# Patient Record
Sex: Male | Born: 1990 | Race: Black or African American | Hispanic: No | Marital: Single | Smoking: Former smoker
Health system: Southern US, Community
[De-identification: ages and names within clinical notes are randomized; demographics above are authoritative.]

## PROBLEM LIST (undated history)

## (undated) DIAGNOSIS — L0232 Furuncle of buttock: Secondary | ICD-10-CM

## (undated) HISTORY — PX: WISDOM TOOTH EXTRACTION: SHX21

---
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
## 2011-01-05 ENCOUNTER — Emergency Department (HOSPITAL_COMMUNITY)
Admission: EM | Admit: 2011-01-05 | Discharge: 2011-01-05 | Disposition: A | Discharge status: Home / Self Care | Payer: Medicaid Other | Attending: Emergency Medicine | Admitting: Emergency Medicine

## 2011-01-05 ENCOUNTER — Encounter: Payer: Self-pay | Admitting: *Deleted

## 2011-01-05 DIAGNOSIS — F17200 Nicotine dependence, unspecified, uncomplicated: Secondary | ICD-10-CM | POA: Insufficient documentation

## 2011-01-05 DIAGNOSIS — L0501 Pilonidal cyst with abscess: Secondary | ICD-10-CM | POA: Insufficient documentation

## 2011-01-05 HISTORY — DX: Furuncle of buttock: L02.32

## 2011-01-05 MED ORDER — LIDOCAINE-EPINEPHRINE 2 %-1:100000 IJ SOLN
20.0000 mL | Freq: Once | INTRAMUSCULAR | Status: AC
  Administered 2011-01-05: 20 mL via INTRADERMAL
  Filled 2011-01-05: qty 20

## 2011-01-05 MED ORDER — OXYCODONE-ACETAMINOPHEN 5-325 MG PO TABS
1.0000 | ORAL_TABLET | Freq: Once | ORAL | Status: AC
  Administered 2011-01-05: 1 via ORAL
  Filled 2011-01-05: qty 1

## 2011-01-05 MED ORDER — HYDROCODONE-ACETAMINOPHEN 5-500 MG PO TABS: 1.0000 | ORAL_TABLET | Freq: Four times a day (QID) | ORAL | Status: AC | PRN

## 2011-01-05 MED ORDER — DOXYCYCLINE HYCLATE 100 MG PO CAPS: 100.0000 mg | ORAL_CAPSULE | Freq: Two times a day (BID) | ORAL | Status: AC

## 2011-01-05 MED ORDER — LIDOCAINE HCL 2 % IJ SOLN
INTRAMUSCULAR | Status: AC
  Filled 2011-01-05: qty 1

## 2011-01-05 NOTE — ED Notes (Signed)
Pt states "I have a boil, been there like 2-3 days, it has a head on it"; pt indicates abcess to right buttock

## 2011-01-05 NOTE — ED Provider Notes (Signed)
History     CSN: [id number] Arrival date & time: 01/05/2011  6:09 PM   First MD Initiated Contact with Patient 01/05/11 1903      Chief Complaint  Patient presents with  . Abscess    (Consider location/radiation/quality/duration/timing/severity/associated sxs/prior treatment) HPI  Patient presents to emergency department complaining of a 2-3 day history of "boil" to his buttocks. Patient states symptoms are gradual onset, persistent, and worsening with increasing pain and swelling at the site of abscess. Patient states he had similar symptoms in the past but states "I drained it myself by popping it with a needle." Patient states pain is aggravated by touch, particularly by sitting on his buttocks. Patient denies fevers, chills, pain with defecation, abdominal pain, nausea, vomiting. Patient has not taken anything prior to arrival for pain. Patient denies any known medical problems and takes no medicines on regular basis.  Past Medical History  Diagnosis Date  . Boil of buttock     History reviewed. No pertinent past surgical history.  No family history on file.  History  Substance Use Topics  . Smoking status: Current Everyday Smoker -- 0.5 packs/day  . Smokeless tobacco: Not on file  . Alcohol Use: Yes     socially      Review of Systems  All other systems reviewed and are negative.    Allergies  Review of patient's allergies indicates no known allergies.  Home Medications  No current outpatient prescriptions on file.  BP 145/80  Pulse 82  Temp 98.7 F (37.1 C)  Resp 18  Wt 230 lb (104.327 kg)  SpO2 100%  Physical Exam  Nursing note and vitals reviewed. Constitutional: He is oriented to person, place, and time. He appears well-developed and well-nourished. No distress.  HENT:  Head: Normocephalic and atraumatic.  Eyes: Conjunctivae are normal.  Neck: Normal range of motion. Neck supple.  Cardiovascular: Normal rate, regular rhythm, normal heart sounds  and intact distal pulses.  Exam reveals no gallop and no friction rub.   No murmur heard. Pulmonary/Chest: Effort normal and breath sounds normal. No respiratory distress. He has no wheezes. He has no rales. He exhibits no tenderness.  Abdominal: Bowel sounds are normal. He exhibits no distension and no mass. There is no tenderness. There is no rebound and no guarding.  Musculoskeletal: Normal range of motion. He exhibits no edema and no tenderness.  Neurological: He is alert and oriented to person, place, and time.  Skin: Skin is warm and dry. No rash noted. He is not diaphoretic. There is erythema.       Quarter size swelling and induration of the coccyx region of buttocks with faint erythema and tenderness to palpation with central area of the opening with scant drainage. No tenderness to palpation of surrounding soft tissue buttocks. No radiation erythema.  Psychiatric: He has a normal mood and affect.    ED Course  Procedures (including critical care time)  INCISION AND DRAINAGE Performed by: Jenness Corner Consent: Verbal consent obtained. Risks and benefits: risks, benefits and alternatives were discussed Type: abscess  Body area: coccyx  Anesthesia: local infiltration  Local anesthetic: lidocaine 2% with epinephrine  Anesthetic total: 8 ml  Complexity: complex Blunt dissection to break up loculations  Drainage: purulent  Drainage amount: copious  Packing material: 1/4 in iodoform gauze  Patient tolerance: Patient tolerated the procedure well with no immediate complications.     Labs Reviewed - No data to display No results found.   1. Pilonidal abscess  MDM  No signs or symptoms of cellulitis. Afebrile. Abdomen soft and nontender. No surrounding soft tissue crepitous. No pain with defecation.         Jenness Corner, [place] 01/05/11 2036

## 2011-01-05 NOTE — ED Provider Notes (Signed)
Medical screening examination/treatment/procedure(s) were performed by non-physician practitioner and as supervising physician I was immediately available for consultation/collaboration. Devoria Albe, MD, Armando Gang   Ward Givens, MD 01/05/11 2258

## 2011-07-05 ENCOUNTER — Emergency Department (HOSPITAL_COMMUNITY): Payer: Medicaid Other

## 2011-07-05 ENCOUNTER — Emergency Department (HOSPITAL_COMMUNITY)
Admission: EM | Admit: 2011-07-05 | Discharge: 2011-07-05 | Disposition: A | Discharge status: Home / Self Care | Payer: Medicaid Other | Attending: Emergency Medicine | Admitting: Emergency Medicine

## 2011-07-05 ENCOUNTER — Encounter (HOSPITAL_COMMUNITY): Payer: Self-pay | Admitting: Emergency Medicine

## 2011-07-05 DIAGNOSIS — F17200 Nicotine dependence, unspecified, uncomplicated: Secondary | ICD-10-CM | POA: Insufficient documentation

## 2011-07-05 DIAGNOSIS — M436 Torticollis: Secondary | ICD-10-CM | POA: Insufficient documentation

## 2011-07-05 MED ORDER — HYDROCODONE-ACETAMINOPHEN 5-325 MG PO TABS
2.0000 | ORAL_TABLET | Freq: Once | ORAL | Status: AC
  Administered 2011-07-05: 2 via ORAL
  Filled 2011-07-05: qty 2

## 2011-07-05 MED ORDER — IBUPROFEN 800 MG PO TABS
800.0000 mg | ORAL_TABLET | Freq: Once | ORAL | Status: AC
  Administered 2011-07-05: 800 mg via ORAL
  Filled 2011-07-05: qty 1

## 2011-07-05 MED ORDER — IBUPROFEN 800 MG PO TABS: 800.0000 mg | ORAL_TABLET | Freq: Three times a day (TID) | ORAL | Status: AC

## 2011-07-05 MED ORDER — HYDROCODONE-ACETAMINOPHEN 5-325 MG PO TABS: 2.0000 | ORAL_TABLET | ORAL | Status: AC | PRN

## 2011-07-05 MED ORDER — DIAZEPAM 5 MG PO TABS
5.0000 mg | ORAL_TABLET | Freq: Once | ORAL | Status: AC
  Administered 2011-07-05: 5 mg via ORAL
  Filled 2011-07-05: qty 1

## 2011-07-05 MED ORDER — DIAZEPAM 5 MG PO TABS: 5.0000 mg | ORAL_TABLET | Freq: Two times a day (BID) | ORAL | Status: AC

## 2011-07-05 NOTE — Discharge Instructions (Signed)
Torticollis, Acute You have suddenly (acutely) developed a twisted neck (torticollis). This is usually a self-limited condition. CAUSES  Acute torticollis may be caused by malposition, trauma or infection. Most commonly, acute torticollis is caused by sleeping in an awkward position. Torticollis may also be caused by the flexion, extension or twisting of the neck muscles beyond their normal position. Sometimes, the exact cause may not be known. SYMPTOMS  Usually, there is pain and limited movement of the neck. Your neck may twist to one side. DIAGNOSIS  The diagnosis is often made by physical examination. X-rays, CT scans or MRIs may be done if there is a history of trauma or concern of infection. TREATMENT  For a common, stiff neck that develops during sleep, treatment is focused on relaxing the contracted neck muscle. Medications (including shots) may be used to treat the problem. Most cases resolve in several days. Torticollis usually responds to conservative physical therapy. If left untreated, the shortened and spastic neck muscle can cause deformities in the face and neck. Rarely, surgery is required. HOME CARE INSTRUCTIONS   Use over-the-counter and prescription medications as directed by your caregiver.   Do stretching exercises and massage the neck as directed by your caregiver.   Follow up with physical therapy if needed and as directed by your caregiver.  SEEK IMMEDIATE MEDICAL CARE IF:   You develop difficulty breathing or noisy breathing (stridor).   You drool, develop trouble swallowing or have pain with swallowing.   You develop numbness or weakness in the hands or feet.   You have changes in speech or vision.   You have problems with urination or bowel movements.   You have difficulty walking.   You have a fever.   You have increased pain.  MAKE SURE YOU:   Understand these instructions.   Will watch your condition.   Will get help right away if you are not  doing well or get worse.  Document Released: 01/06/2000 Document Revised: 12/28/2010 Document Reviewed: 02/16/2009 [employer] Patient Information 2012 [employer]. 

## 2011-07-05 NOTE — ED Notes (Signed)
Pt given rx x2, discharge and follow up information without further questions after speaking with MD. Pt denies further needs at this time and ambulates to lobby in NAD

## 2011-07-05 NOTE — ED Notes (Signed)
PT. REPORTS LEFT NECK PAIN / STIFFNESS ONSET THIS MORNING WHILE STRETCHING AFTER GETTING UP , DENIES FEVER OR CHILLS.

## 2011-07-07 NOTE — ED Provider Notes (Signed)
History     CSN: [id number]  Arrival date & time 07/05/11  2011   First MD Initiated Contact with Patient 07/05/11 2222      Chief Complaint  Patient presents with  . Neck Pain    (Consider location/radiation/quality/duration/timing/severity/associated sxs/prior treatment) HPI Comments: Patient presents with L paraspinal neck pain that started the morning prior to presentation.  He states he was stretching in bed, felt a "pop" in his neck, and has had pain and stiffness since.  No fever, vomiting, weakness, numbness, tingling, vision change, headache.  The history is provided by the patient.    Past Medical History  Diagnosis Date  . Boil of buttock     History reviewed. No pertinent past surgical history.  No family history on file.  History  Substance Use Topics  . Smoking status: Current Everyday Smoker -- 0.5 packs/day  . Smokeless tobacco: Not on file  . Alcohol Use: Yes     socially      Review of Systems  Constitutional: Negative for activity change and appetite change.  HENT: Positive for neck pain and neck stiffness. Negative for congestion and rhinorrhea.   Respiratory: Negative for cough, chest tightness and shortness of breath.   Cardiovascular: Negative for chest pain.  Gastrointestinal: Negative for nausea, vomiting and abdominal pain.  Genitourinary: Negative for dysuria and hematuria.  Musculoskeletal: Negative for back pain.  Skin: Negative for rash.  Neurological: Negative for headaches.    Allergies  Review of patient's allergies indicates no known allergies.  Home Medications   Current Outpatient Rx  Name Route Sig Dispense Refill  . DIAZEPAM 5 MG PO TABS Oral Take 1 tablet (5 mg total) by mouth 2 (two) times daily. 6 tablet 0  . HYDROCODONE-ACETAMINOPHEN 5-325 MG PO TABS Oral Take 2 tablets by mouth every 4 (four) hours as needed for pain. 10 tablet 0  . IBUPROFEN 800 MG PO TABS Oral Take 1 tablet (800 mg total) by mouth 3 (three) times  daily. 21 tablet 0    BP 113/73  Pulse 58  Temp 98.7 F (37.1 C) (Oral)  Resp 16  SpO2 100%  Physical Exam  Constitutional: He is oriented to person, place, and time. He appears well-developed and well-nourished. No distress.  HENT:  Head: Normocephalic and atraumatic.  Mouth/Throat: Oropharynx is clear and moist. No oropharyngeal exudate.  Eyes: Conjunctivae and EOM are normal. Pupils are equal, round, and reactive to light.  Neck: Normal range of motion. Neck supple.       Head rotated to R.  TTP L paraspinal muscles, trapezius, and SCM with spasm.  Cardiovascular: Normal rate, regular rhythm and normal heart sounds.   No murmur heard. Pulmonary/Chest: Effort normal and breath sounds normal. No respiratory distress.  Abdominal: Soft. There is no tenderness. There is no rebound and no guarding.  Musculoskeletal: Normal range of motion. He exhibits no edema and no tenderness.  Neurological: He is alert and oriented to person, place, and time. No cranial nerve deficit.       Equal grip strengths bilaterally    ED Course  Procedures (including critical care time)  Labs Reviewed - No data to display No results found.   1. Torticollis       MDM  Neck pain and spasm after stretching.  NO weakness, numbness, tingling, fever, vomiting.  Exam and history consistent with torticollis. NSAIDS, heat, muscle relaxers.  Follow up PCP.        Glynn Octave, MD 07/07/11 [phone]

## 2011-10-15 ENCOUNTER — Encounter (HOSPITAL_COMMUNITY): Payer: Self-pay | Admitting: Emergency Medicine

## 2011-10-15 ENCOUNTER — Emergency Department (HOSPITAL_COMMUNITY): Payer: Medicaid Other

## 2011-10-15 ENCOUNTER — Emergency Department (HOSPITAL_COMMUNITY)
Admission: EM | Admit: 2011-10-15 | Discharge: 2011-10-16 | Disposition: A | Discharge status: Home / Self Care | Payer: Medicaid Other | Attending: Emergency Medicine | Admitting: Emergency Medicine

## 2011-10-15 DIAGNOSIS — F17200 Nicotine dependence, unspecified, uncomplicated: Secondary | ICD-10-CM | POA: Insufficient documentation

## 2011-10-15 DIAGNOSIS — S61509A Unspecified open wound of unspecified wrist, initial encounter: Secondary | ICD-10-CM | POA: Insufficient documentation

## 2011-10-15 DIAGNOSIS — IMO0002 Reserved for concepts with insufficient information to code with codable children: Secondary | ICD-10-CM

## 2011-10-15 DIAGNOSIS — W268XXA Contact with other sharp object(s), not elsewhere classified, initial encounter: Secondary | ICD-10-CM | POA: Insufficient documentation

## 2011-10-15 NOTE — ED Notes (Signed)
PT. SLIPPED AND FELL ON BEER BOTTLE THIS EVENING , PRESENTS WITH LACERATION APPROX. 1 INCH AT RIGHT ANTERIOR WRIST , DRY PRESSURE DRESSING APPLIED AT TRIAGE WITH SLIGHT BLEEDING .

## 2011-10-16 NOTE — ED Provider Notes (Signed)
History     CSN: [id number]  Arrival date & time 10/15/11  2123   First MD Initiated Contact with Patient 10/16/11 0010      Chief Complaint  Patient presents with  . Laceration    (Consider location/radiation/quality/duration/timing/severity/associated sxs/prior treatment) HPI Comments: Laceration to inner R wrist full ROM. Denied numbness loss of function   Patient is a 21 y.o. male presenting with skin laceration. The history is provided by the patient.  Laceration  The incident occurred 1 to 2 hours ago. The laceration is located on the right arm. The laceration is 1 cm in size. The laceration mechanism was a broken glass. The pain is at a severity of 3/10. The pain is mild. The pain has been constant since onset. He reports no foreign bodies present. His tetanus status is UTD.    Past Medical History  Diagnosis Date  . Boil of buttock     History reviewed. No pertinent past surgical history.  No family history on file.  History  Substance Use Topics  . Smoking status: Current Every Day Smoker -- 0.5 packs/day  . Smokeless tobacco: Not on file  . Alcohol Use: Yes     socially      Review of Systems  Gastrointestinal: Negative for nausea.  Musculoskeletal: Negative for joint swelling.  Skin: Positive for wound.  Neurological: Negative for weakness and numbness.    Allergies  Review of patient's allergies indicates no known allergies.  Home Medications  No current outpatient prescriptions on file.  BP 127/75  Pulse 55  Temp 98 F (36.7 C) (Oral)  Resp 18  SpO2 100%  Physical Exam  Constitutional: He appears well-developed.  HENT:  Head: Normocephalic.  Eyes: Pupils are equal, round, and reactive to light.  Neck: Normal range of motion.  Cardiovascular: Normal rate.   Pulmonary/Chest: Effort normal.  Musculoskeletal: Normal range of motion. He exhibits no edema and no tenderness.       Right wrist: He exhibits laceration. He exhibits normal range  of motion, no tenderness and no swelling.  Neurological: He is alert.  Skin: Skin is warm.    ED Course  LACERATION REPAIR Date/Time: 10/16/2011 12:44 AM Performed by: Arman Filter Authorized by: Arman Filter Consent: Verbal consent obtained. Written consent not obtained. Risks and benefits: risks, benefits and alternatives were discussed Consent given by: patient Patient understanding: patient states understanding of the procedure being performed Patient identity confirmed: verbally with patient Time out: Immediately prior to procedure a "time out" was called to verify the correct patient, procedure, equipment, support staff and site/side marked as required. Body area: upper extremity Location details: right wrist Laceration length: 1 cm Foreign bodies: glass Tendon involvement: none Nerve involvement: none Vascular damage: no Anesthesia: local infiltration Local anesthetic: lidocaine 1% with epinephrine Anesthetic total: 2 ml Patient sedated: no Preparation: Patient was prepped and draped in the usual sterile fashion. Irrigation solution: saline Irrigation method: syringe Amount of cleaning: extensive Debridement: none Degree of undermining: none Skin closure: 4-0 Prolene Number of sutures: 5 Approximation: close Approximation difficulty: simple Dressing: antibiotic ointment and gauze packing Patient tolerance: Patient tolerated the procedure well with no immediate complications.   (including critical care time)  Labs Reviewed - No data to display Dg Wrist Complete Right  10/15/2011  *RADIOLOGY REPORT*  Clinical Data: Laceration.  RIGHT WRIST - COMPLETE 3+ VIEW  Comparison: None.  Findings: Anatomic alignment bones of the right wrist.  Bandage was applied to the radial aspect of  the wrist.  No fracture or radiopaque foreign body.  IMPRESSION: No acute abnormality.  No radiopaque foreign body.   Original Report Authenticated By: Andreas Newport, M.D.      1.  Laceration       MDM  Laceration         Arman Filter, NP 10/16/11 0045  Arman Filter, NP 10/16/11 0045

## 2011-10-16 NOTE — ED Provider Notes (Signed)
Medical screening examination/treatment/procedure(s) were performed by non-physician practitioner and as supervising physician I was immediately available for consultation/collaboration.  Sunnie Nielsen, MD 10/16/11 [phone]

## 2011-10-26 ENCOUNTER — Encounter (HOSPITAL_COMMUNITY): Payer: Self-pay | Admitting: *Deleted

## 2011-10-26 ENCOUNTER — Emergency Department (HOSPITAL_COMMUNITY)
Admission: EM | Admit: 2011-10-26 | Discharge: 2011-10-26 | Disposition: A | Discharge status: Home / Self Care | Payer: Medicaid Other | Attending: Emergency Medicine | Admitting: Emergency Medicine

## 2011-10-26 DIAGNOSIS — Z4802 Encounter for removal of sutures: Secondary | ICD-10-CM | POA: Insufficient documentation

## 2011-10-26 DIAGNOSIS — F17200 Nicotine dependence, unspecified, uncomplicated: Secondary | ICD-10-CM | POA: Insufficient documentation

## 2011-10-26 NOTE — ED Provider Notes (Signed)
History   This chart was scribed for Gerhard Munch, MD by Melba Coon. The patient was seen in room TR05C/TR05C and the patient's care was started at 3:44PM.    CSN: [id number]  Arrival date & time 10/26/11  1516   First MD Initiated Contact with Patient 10/26/11 1533      Chief Complaint  Patient presents with  . Suture / Staple Removal    (Consider location/radiation/quality/duration/timing/severity/associated sxs/prior treatment) The history is provided by the patient. No language interpreter was used.   Seth Anderson is a 21 y.o. male who presents to the Emergency Department for a suture removal from the right wrist that were placed 11 days ago after he cut himself with glass. Wound appears well healed. Denies fever and chills. No other complaints. No other pertinent medical symptoms.  Past Medical History  Diagnosis Date  . Boil of buttock     History reviewed. No pertinent past surgical history.  No family history on file.  History  Substance Use Topics  . Smoking status: Current Every Day Smoker -- 0.5 packs/day  . Smokeless tobacco: Not on file  . Alcohol Use: Yes     socially      Review of Systems 10 Systems reviewed and all are negative for acute change except as noted in the HPI.   Allergies  Review of patient's allergies indicates no known allergies.  Home Medications  No current outpatient prescriptions on file.  BP 110/60  Pulse 62  Temp 97.9 F (36.6 C) (Oral)  Resp 18  SpO2 99%  Physical Exam  Nursing note and vitals reviewed. Constitutional: He is oriented to person, place, and time. He appears well-developed and well-nourished. No distress.  HENT:  Head: Normocephalic and atraumatic.  Eyes: EOM are normal.  Neck: Neck supple. No tracheal deviation present.  Cardiovascular: Normal rate.   Pulmonary/Chest: Effort normal. No respiratory distress.  Musculoskeletal: Normal range of motion.  Neurological: He is alert and oriented  to person, place, and time.  Skin: Skin is warm and dry.       Wound appears clean, dry and intact.  Psychiatric: He has a normal mood and affect. His behavior is normal.    ED Course  SUTURE REMOVAL Date/Time: 10/26/2011 3:00 PM Performed by: Gerhard Munch Authorized by: Gerhard Munch Consent: Verbal consent obtained. Time out: Immediately prior to procedure a "time out" was called to verify the correct patient, procedure, equipment, support staff and site/side marked as required. Body area: upper extremity Location details: right wrist Wound Appearance: clean Sutures Removed: 5 Post-removal: dressing applied Facility: sutures placed in this facility Patient tolerance: Patient tolerated the procedure well with no immediate complications.   (including critical care time)  COORDINATION OF CARE:  3:45PM - 5 sutures were removed and site will be covered with a Band-Aid. Pt is ready for d/c.   Labs Reviewed - No data to display No results found.   No diagnosis found.    MDM  I personally performed the services described in this documentation, which was scribed in my presence. The recorded information has been reviewed and considered.  The patient presents for suture removal.  Absent interval changes, or concerning findings today he was discharged in stable condition after his sutures were removed.   Gerhard Munch, MD 10/26/11 1726

## 2011-10-26 NOTE — ED Notes (Signed)
Pt here for a suture removal  He had sutures in the rt wrist 11 days ago.  Suture line appears well-healed but some redness surrounding

## 2012-11-06 ENCOUNTER — Encounter: Payer: Self-pay | Admitting: Internal Medicine

## 2012-11-06 ENCOUNTER — Ambulatory Visit: Payer: Medicaid Other | Attending: Internal Medicine | Admitting: Internal Medicine

## 2012-11-06 VITALS — BP 119/76 | HR 84 | Temp 98.8°F | Resp 18

## 2012-11-06 DIAGNOSIS — M25569 Pain in unspecified knee: Secondary | ICD-10-CM | POA: Insufficient documentation

## 2012-11-06 DIAGNOSIS — G8929 Other chronic pain: Secondary | ICD-10-CM | POA: Insufficient documentation

## 2012-11-06 DIAGNOSIS — M545 Low back pain, unspecified: Secondary | ICD-10-CM | POA: Insufficient documentation

## 2012-11-06 DIAGNOSIS — M549 Dorsalgia, unspecified: Secondary | ICD-10-CM

## 2012-11-06 MED ORDER — IBUPROFEN 600 MG PO TABS: 600.0000 mg | ORAL_TABLET | Freq: Three times a day (TID) | ORAL | Status: DC | PRN

## 2012-11-06 NOTE — Progress Notes (Signed)
Patient ID: Seth Anderson, male   DOB: 03/21/1990, 22 y.o.   MRN: [id number]  Chief complaint Low back and right knee pain   History of present illness Young African American male here to establish care. Patient reports symptoms of low back pain for possible years and has been told that he has mild scoliosis. He denies having any imaging done in the past. He also reports having pain in his right knee and been told of having some tendinitis. He reports having pain over the back and then even at rest aggravated with movement. Denies any radiation of the pain. Denies fever, chills, headache, blurred vision, dizziness, chest pain, palpitation, shortness of breath, nausea, vomiting, abdominal pain, bowel or urinary symptoms. Denies tingling or numbness of the extremity.    Vital signs in last 24 hours:  Filed Vitals:   11/06/12 1526  BP: 119/76  Pulse: 84  Temp: 98.8 F (37.1 C)  TempSrc: Oral  Resp: 18  SpO2: 97%     Physical Exam:  General: Young male in no acute distress. HEENT: no pallor, no icterus, moist oral mucosa,  Heart: Normal  s1 &s2  Regular rate and rhythm, without murmurs, rubs, gallops. Lungs: Clear to auscultation bilaterally. Abdomen: Soft, nontender, nondistended, positive bowel sounds. Extremities: No clubbing cyanosis or edema with positive pedal pulses. No lower back abnormality or swelling. SLTR negative. Normal range of motion of right knee. No swelling or deformity Neuro: Alert, awake, oriented x3, nonfocal.   Lab Results:  Basic Metabolic Panel: No results found for this basename: na, k, cl, co2, bun, creatinine, glucose, calcium   CBC: No results found for this basename: wbc, hgb, hct, plt, mcv, neutroabs, lymphsabs, monoabs, eosabs, basosabs    No results found for this or any previous visit (from the past 240 hour(s)).  Studies/Results: No results found.  Medications:   Assessment/Plan:  Chronic low back and right knee pain Obtain x ray   of his lumbar spine and right knee. We'll order when necessary Motrin for pain. instructed ROM exercise Follow up in 4 weeks    Harjit Leider 11/06/2012, 4:05 PM

## 2012-11-06 NOTE — Progress Notes (Signed)
Pt here to establish care for chronic back/knee pain for yrs. No injury reported C/o sharp,achy pain No medical hx noted

## 2012-11-12 ENCOUNTER — Other Ambulatory Visit (HOSPITAL_COMMUNITY): Payer: Self-pay

## 2012-12-11 ENCOUNTER — Ambulatory Visit: Payer: Self-pay | Admitting: Internal Medicine

## 2013-02-18 ENCOUNTER — Ambulatory Visit: Payer: Self-pay

## 2013-06-22 ENCOUNTER — Emergency Department (HOSPITAL_COMMUNITY)
Admission: EM | Admit: 2013-06-22 | Discharge: 2013-06-23 | Disposition: A | Discharge status: Home / Self Care | Payer: Self-pay | Attending: Emergency Medicine | Admitting: Emergency Medicine

## 2013-06-22 ENCOUNTER — Emergency Department (HOSPITAL_COMMUNITY): Payer: Self-pay

## 2013-06-22 ENCOUNTER — Encounter (HOSPITAL_COMMUNITY): Payer: Self-pay | Admitting: Emergency Medicine

## 2013-06-22 DIAGNOSIS — Z872 Personal history of diseases of the skin and subcutaneous tissue: Secondary | ICD-10-CM | POA: Insufficient documentation

## 2013-06-22 DIAGNOSIS — Y9367 Activity, basketball: Secondary | ICD-10-CM | POA: Insufficient documentation

## 2013-06-22 DIAGNOSIS — S93402A Sprain of unspecified ligament of left ankle, initial encounter: Secondary | ICD-10-CM

## 2013-06-22 DIAGNOSIS — Y9239 Other specified sports and athletic area as the place of occurrence of the external cause: Secondary | ICD-10-CM | POA: Insufficient documentation

## 2013-06-22 DIAGNOSIS — M25572 Pain in left ankle and joints of left foot: Secondary | ICD-10-CM

## 2013-06-22 DIAGNOSIS — F17200 Nicotine dependence, unspecified, uncomplicated: Secondary | ICD-10-CM | POA: Insufficient documentation

## 2013-06-22 DIAGNOSIS — X500XXA Overexertion from strenuous movement or load, initial encounter: Secondary | ICD-10-CM | POA: Insufficient documentation

## 2013-06-22 DIAGNOSIS — S93409A Sprain of unspecified ligament of unspecified ankle, initial encounter: Secondary | ICD-10-CM | POA: Insufficient documentation

## 2013-06-22 DIAGNOSIS — Y92838 Other recreation area as the place of occurrence of the external cause: Secondary | ICD-10-CM

## 2013-06-22 NOTE — ED Notes (Signed)
Patient was playing basketball and rolled left ankle.  Patient states that there is pain, with some swelling on lateral aspect of left ankle.

## 2013-06-22 NOTE — ED Notes (Signed)
Patient transported to X-ray 

## 2013-06-22 NOTE — ED Notes (Signed)
Left ankle is elevated and ice given to patient for comfort.

## 2013-06-22 NOTE — ED Provider Notes (Signed)
CSN: [id number]     Arrival date & time 06/22/13  2042 History  This chart was scribed for a non-physician practitioner, Raymon Mutton PA-C, working with Geoffery Lyons, MD by [place], ED Scribe. The patient was seen in TR09C/TR09C. The patient's care was started at 10:42 PM.  Chief Complaint  Patient presents with  . Ankle Pain    The history is provided by the patient. No language interpreter was used.    HPI Comments: Seth Anderson is a 23 y.o. male who presents to the Emergency Department complaining of throbbing, constant left ankle pain that radiates up the lower leg with associated swelling to the lateral side of ankle. Pt states he fell and everted his ankle while playing basketball around 4:00PM today. He is not having any foot pain. He states that he took 2 tablets of generic pain medication prior to arrival. He also states that he has been elevating his ankle to alleviate swelling but has not applied any ice to it. Pt denies any numbness, tingling, or weakness in the left lower extremity. He says he has no prior injury to his ankle. He has no history of DM or HTN.  Past Medical History  Diagnosis Date  . Boil of buttock    History reviewed. No pertinent past surgical history. Family History  Problem Relation Age of Onset  . Diabetes Mother    History  Substance Use Topics  . Smoking status: Current Every Day Smoker -- 0.50 packs/day  . Smokeless tobacco: Not on file     Comment: states he will cut back  . Alcohol Use: Yes     Comment: socially    Review of Systems  Musculoskeletal: Positive for arthralgias (left ankle).  Neurological: Negative for weakness and numbness.      Allergies  Review of patient's allergies indicates no known allergies.  Home Medications   Prior to Admission medications   Medication Sig Start Date End Date Taking? Authorizing Provider  HYDROcodone-acetaminophen (NORCO/VICODIN) 5-325 MG per tablet Take 1 tablet by mouth every 6  (six) hours as needed for moderate pain or severe pain. 06/23/13   Breslin Burklow, PA-C  ibuprofen (ADVIL,MOTRIN) 600 MG tablet Take 1 tablet (600 mg total) by mouth every 8 (eight) hours as needed for pain. 11/06/12   Nishant Dhungel, MD  ibuprofen (ADVIL,MOTRIN) 600 MG tablet Take 1 tablet (600 mg total) by mouth every 6 (six) hours as needed. 06/23/13   Cyndal Kasson, PA-C   Triage Vitals: BP 112/57  Pulse 59  Temp(Src) 98.2 F (36.8 C) (Oral)  Resp 15  SpO2 99% Physical Exam  Nursing note and vitals reviewed. Constitutional: He is oriented to person, place, and time. He appears well-developed and well-nourished. No distress.  HENT:  Head: Normocephalic and atraumatic.  Eyes: Conjunctivae and EOM are normal. Pupils are equal, round, and reactive to light. Right eye exhibits no discharge. Left eye exhibits no discharge.  Neck: Normal range of motion. Neck supple.  Cardiovascular: Normal rate, regular rhythm and normal heart sounds.  Exam reveals no friction rub.   No murmur heard. Pulses:      Radial pulses are 2+ on the right side, and 2+ on the left side.       Dorsalis pedis pulses are 2+ on the right side, and 2+ on the left side.       Posterior tibial pulses are 2+ on the right side, and 2+ on the left side.  Pulmonary/Chest: Effort normal and breath sounds  normal. No respiratory distress. He has no wheezes. He has no rales.  Musculoskeletal: He exhibits edema and tenderness.       Left foot: He exhibits decreased range of motion, tenderness, bony tenderness and swelling. He exhibits normal capillary refill, no crepitus, no deformity and no laceration.       Feet:  Swelling and beginnings of ecchymosis identified to the lateral aspect of the left ankle. Discomfort upon palpation to the lateral malleolus-talus fibular ligament region. Decreased inversion, eversion, dorsi flexion plantar flexion secondary to pain-motion still intact. Patient is able to wiggle toes.  Neurological: He  is alert and oriented to person, place, and time. No cranial nerve deficit. He exhibits normal muscle tone. Coordination normal.  Cranial nerves III-XII grossly intact Strength 5+/5+ to lower extremities bilaterally with resistance applied, equal distribution noted Strength intact to the digits of the left foot Sensation intact with differentiation sharp and dull touch Gait proper, proper balance - negative sway, negative drift, negative step-offs  Skin: Skin is warm and dry. No rash noted. He is not diaphoretic. No erythema.  Psychiatric: He has a normal mood and affect. His behavior is normal. Thought content normal.    ED Course  Procedures (including critical care time) DIAGNOSTIC STUDIES: Oxygen Saturation is 99% on room air, normal by my interpretation.    COORDINATION OF CARE: 10:48 PM- Treatment plan was discussed with patient who verbalizes understanding and agrees.     Labs Review Labs Reviewed - No data to display  Imaging Review Dg Tibia/fibula Left  06/23/2013   CLINICAL DATA:  Basketball injury.  Left leg pain.  EXAM: LEFT TIBIA AND FIBULA - 2 VIEW  COMPARISON:  None.  FINDINGS: There is no evidence of fracture or other focal bone lesions. Soft tissues are unremarkable.  IMPRESSION: Negative.   Electronically Signed   By: Amie Portland M.D.   On: 06/23/2013 00:05   Dg Ankle Complete Left  06/22/2013   CLINICAL DATA:  Injury while playing basketball, medial ankle pain  EXAM: LEFT ANKLE COMPLETE - 3+ VIEW  COMPARISON:  None  FINDINGS: Soft tissue swelling about the ankle without evidence of acute fracture or malalignment. The ankle mortise remains congruent. The talar dome is intact. No ankle joint effusion. Normal bony mineralization.  IMPRESSION: Soft tissue swelling without acute fracture or malalignment.   Electronically Signed   By: Malachy Moan M.D.   On: 06/22/2013 21:30     EKG Interpretation None      MDM   Final diagnoses:  Left ankle pain  Left ankle  sprain   Medications  ibuprofen (ADVIL,MOTRIN) tablet 800 mg (not administered)    Filed Vitals:   06/22/13 2049 06/22/13 2331  BP: 112/57 111/89  Pulse: 59 49  Temp: 98.2 F (36.8 C) 98 F (36.7 C)  TempSrc: Oral Oral  Resp: 15 18  SpO2: 99% 100%   I personally performed the services described in this documentation, which was scribed in my presence. The recorded information has been reviewed and is accurate.  Plain film of left ankle noted soft tissue swelling without acute fracture or malalignment. Plain film left tib-fib region negative for acute osseous injury. Negative findings or dislocation. Plain films unremarkable. Patient neurovascularly intact. Negative focal neurological deficits noted. Sensation intact. Suspicion to be ankle sprain, cannot rule out possible talofibular ligament injury. Patient placed in ASO and crutches given for comfort. Patient not septic appearing. Patient stable, afebrile. Discharged patient. Discharge patient with small dose of pain medications-discussed  with patient course, cautions, disposal technique. Referred patient to orthopedics and primary care provider. Discussed with patient to rest, ice, elevate. Discussed with patient to avoid any physical or strenuous activity. Discussed with patient to closely monitor symptoms and if symptoms are to worsen or change to report back to the ED - strict return instructions given.  Patient agreed to plan of care, understood, all questions answered.   AGCO CorporationMarissa Iridian Reader, PA-C 06/23/13 0405

## 2013-06-23 MED ORDER — HYDROCODONE-ACETAMINOPHEN 5-325 MG PO TABS: 1.0000 | ORAL_TABLET | Freq: Once | ORAL | Status: DC

## 2013-06-23 MED ORDER — IBUPROFEN 400 MG PO TABS
800.0000 mg | ORAL_TABLET | Freq: Once | ORAL | Status: AC
  Administered 2013-06-23: 800 mg via ORAL
  Filled 2013-06-23: qty 2

## 2013-06-23 MED ORDER — IBUPROFEN 600 MG PO TABS: 600.0000 mg | ORAL_TABLET | Freq: Four times a day (QID) | ORAL | Status: DC | PRN

## 2013-06-23 MED ORDER — HYDROCODONE-ACETAMINOPHEN 5-325 MG PO TABS: 1.0000 | ORAL_TABLET | Freq: Four times a day (QID) | ORAL | Status: DC | PRN

## 2013-06-23 NOTE — ED Provider Notes (Signed)
Medical screening examination/treatment/procedure(s) were performed by non-physician practitioner and as supervising physician I was immediately available for consultation/collaboration.     Geoffery Lyons, MD 06/23/13 [phone]

## 2013-06-23 NOTE — Discharge Instructions (Signed)
Please call your doctor for a followup appointment within 24-48 hours. When you talk to your doctor please let them know that you were seen in the emergency department and have them acquire all of your records so that they can discuss the findings with you and formulate a treatment plan to fully care for your new and ongoing problems. Please call and set-up an appointment with your primary care provider to be seen and re-assessed Please rest and stay hydrated While on pain medications there is be no drinking alcohol, driving, operating any heavy machinery if there is extra please dispose in a proper manner. Please do not take any extra Tylenol with this medication for this can lead to Tylenol overdose and liver failure. Please avoid any physical or strenuous activity Please do not apply pressure to the left foot - please use crutches at all times Please rest, ice, and elevate - please ice at least 5-6 times per day at 20 minute intervals Please keep ASO brace on when active, can remove when resting Please continue to monitor symptoms closely and if symptoms are to worsen or change (fever greater than 101, chills, sweating, nausea, vomiting, chest pain, shortness of breath, difficulty breathing, fall, injury, numbness, tingling, loss of sensation, swelling, red streaks, decreased range of motion to the toes) please report back to the ED immediately    Ankle Sprain An ankle sprain is an injury to the strong, fibrous tissues (ligaments) that hold the bones of your ankle joint together.  CAUSES An ankle sprain is usually caused by a fall or by twisting your ankle. Ankle sprains most commonly occur when you step on the outer edge of your foot, and your ankle turns inward. People who participate in sports are more prone to these types of injuries.  SYMPTOMS   Pain in your ankle. The pain may be present at rest or only when you are trying to stand or walk.  Swelling.  Bruising. Bruising may develop  immediately or within 1 to 2 days after your injury.  Difficulty standing or walking, particularly when turning corners or changing directions. DIAGNOSIS  Your caregiver will ask you details about your injury and perform a physical exam of your ankle to determine if you have an ankle sprain. During the physical exam, your caregiver will press on and apply pressure to specific areas of your foot and ankle. Your caregiver will try to move your ankle in certain ways. An X-ray exam may be done to be sure a bone was not broken or a ligament did not separate from one of the bones in your ankle (avulsion fracture).  TREATMENT  Certain types of braces can help stabilize your ankle. Your caregiver can make a recommendation for this. Your caregiver may recommend the use of medicine for pain. If your sprain is severe, your caregiver may refer you to a surgeon who helps to restore function to parts of your skeletal system (orthopedist) or a physical therapist. HOME CARE INSTRUCTIONS   Apply ice to your injury for 1 2 days or as directed by your caregiver. Applying ice helps to reduce inflammation and pain.  Put ice in a plastic bag.  Place a towel between your skin and the bag.  Leave the ice on for 15-20 minutes at a time, every 2 hours while you are awake.  Only take over-the-counter or prescription medicines for pain, discomfort, or fever as directed by your caregiver.  Elevate your injured ankle above the level of your heart as  much as possible for 2 3 days.  If your caregiver recommends crutches, use them as instructed. Gradually put weight on the affected ankle. Continue to use crutches or a cane until you can walk without feeling pain in your ankle.  If you have a plaster splint, wear the splint as directed by your caregiver. Do not rest it on anything harder than a pillow for the first 24 hours. Do not put weight on it. Do not get it wet. You may take it off to take a shower or bath.  You may  have been given an elastic bandage to wear around your ankle to provide support. If the elastic bandage is too tight (you have numbness or tingling in your foot or your foot becomes cold and blue), adjust the bandage to make it comfortable.  If you have an air splint, you may blow more air into it or let air out to make it more comfortable. You may take your splint off at night and before taking a shower or bath. Wiggle your toes in the splint several times per day to decrease swelling. SEEK MEDICAL CARE IF:   You have rapidly increasing bruising or swelling.  Your toes feel extremely cold or you lose feeling in your foot.  Your pain is not relieved with medicine. SEEK IMMEDIATE MEDICAL CARE IF:  Your toes are numb or blue.  You have severe pain that is increasing. MAKE SURE YOU:   Understand these instructions.  Will watch your condition.  Will get help right away if you are not doing well or get worse. Document Released: 01/08/2005 Document Revised: 10/03/2011 Document Reviewed: 01/20/2011 [facility] Patient Information 2014 [place], [place].   Emergency Department Resource Guide 1) Find a Doctor and Pay Out of Pocket Although you won't have to find out who is covered by your insurance plan, it is a good idea to ask around and get recommendations. You will then need to call the office and see if the doctor you have chosen will accept you as a new patient and what types of options they offer for patients who are self-pay. Some doctors offer discounts or will set up payment plans for their patients who do not have insurance, but you will need to ask so you aren't surprised when you get to your appointment.  2) Contact Your Local Health Department Not all health departments have doctors that can see patients for sick visits, but many do, so it is worth a call to see if yours does. If you don't know where your local health department is, you can check in your phone book. The CDC also has a  tool to help you locate your state's health department, and many state websites also have listings of all of their local health departments.  3) Find a Walk-in Clinic If your illness is not likely to be very severe or complicated, you may want to try a walk in clinic. These are popping up all over the country in pharmacies, drugstores, and shopping centers. They're usually staffed by nurse practitioners or physician assistants that have been trained to treat common illnesses and complaints. They're usually fairly quick and inexpensive. However, if you have serious medical issues or chronic medical problems, these are probably not your best option.  No Primary Care Doctor: - Call Health Connect at  [phone] - they can help you locate a primary care doctor that  accepts your insurance, provides certain services, etc. - Physician Referral Service- [phone]  Chronic Pain  Problems: Organization         Address  Phone   Notes  [facility]  [phone] Patients need to be referred by their primary care doctor.   Medication Assistance: Organization         Address  Phone   Notes  [facility]ication [facility] [address] [place], [place] [address] [phone] --Must be a resident of [facility] -- Must have NO insurance coverage whatsoever (no Medicaid/ Medicare, etc.) -- The pt. MUST have a primary care doctor that directs their care regularly and follows them in the community   [facility]  [phone]   [employer]  [phone]    Agencies that provide inexpensive medical care: Organization         Address  Phone   Notes  [facility]  [phone]   [facility]    [phone]   [facility] [address] [place], [place] [address] [phone]   [facility] [address] [place] [phone]   Planned Parenthood    [phone]    [facility]    [phone]   [facility]  [address], [place] Phone:  [phone], Fax:  [phone] Hours of Operation:  9 am - 6 pm, M-F.  Also accepts Medicaid/Medicare and self-pay.  [facility]  [address], [place] Phone: [phone], Fax: [phone]. Hours of Operation:  8:30 am - 5:30 pm, M-F.  Also accepts Medicaid and self-pay.  [facility] [place] [address], [place] Phone: [phone]   [facility] [address] Natasha Bence [place], [place] [phone], Ext. [phone] Mondays & Thursdays: 7-9 AM.  First 15 patients are seen on a first come, first serve basis.    Medicaid-accepting [facility] Providers:  Organization         Address  Phone   Notes  [facility] [address], [place] [phone] Also accepts self-pay patients.  [facility] [address] Laurell Josephs [place], [place]  [phone]   [facility] [address], [place] [phone]   [facility] [address] [place] [phone]   Renaye Rakers [address], [place]   [phone] Only accepts [place] Access [place] patients after they have their name applied to their card.   Self-Pay (no insurance) in [facility]:  Organization         Address  Phone   Notes  Sickle Cell Patients, [facility] [address] [place], [place] [phone]   [facility] [address] [place], [place] [phone]   [facility] [place]  [address], [place] [phone]   [facility]/Dr. Osei-Bonsu  [address], [place] or [address], [place] [phone] Phone number for both [place] and [place] locations is the same.  [facility] [address], [place] [phone]   [facility] [address] [place] or [address] [phone]   [facility] [address], [place] [phone], phone; [phone], fax Sees patients 1st  and 3rd Saturday of every month.  Must not qualify for public or private insurance (i.e. Medicaid, Medicare, [place] Health Choice, Veterans' Benefits)  Household income should be no more than 200% of the poverty level The clinic cannot treat you if you are pregnant or think you are pregnant  Sexually transmitted diseases are not treated at the clinic.    Dental Care: Organization         Address  Phone  Notes  [facility] Department of [facility] [address] [place], [place] [phone] Accepts children up to age 62 who are enrolled in [place] or [place] Health Choice; pregnant women with a Medicaid card; and children who have applied for Medicaid or [place] Health Choice, but were declined, whose parents can pay a reduced fee at time of service.  [facility] Department of [facility]  [address], [place] [phone] Accepts children up to age 12 who are enrolled in [place] or [place] Health Choice; pregnant women with a Medicaid card; and children who have applied for Medicaid or [place] Health Choice, but were declined, whose parents can pay a reduced fee at time of service.  [facility]  [address] [place], [place] [phone] Patients are seen by appointment only. Walk-ins are not accepted. [facility] will see patients 33 years of age and older. Monday - Tuesday (8am-5pm) Most Wednesdays (8:30-5pm) $30 per visit, cash only  [facility]  [address], [facility] [phone] Patients are seen by appointment only. Walk-ins are not accepted. [facility] will see patients 61 years of age and older. One Wednesday  Evening (Monthly: Volunteer Based).  $30 per visit, cash only  [employer]  [phone] for adults; Children under age 33, call Graduate Pediatric Dentistry at [phone]. Children aged 84-14, please call [phone] to request a pediatric application.  Dental services are provided in all areas of dental care including fillings, crowns and bridges, complete and partial dentures, implants, gum treatment, root canals, and extractions. Preventive care is also provided. Treatment is provided to both adults and children. Patients are selected via a lottery and there is often a waiting list.   [facility] [address], [place]  [phone] [email or web address]   [facility] [address] [place], [place] [phone], Ext. [phone] Second and Fourth Thursday of each month, opens at 6:30 AM; Clinic ends at 9 AM.  Patients are seen on a first-come first-served basis, and a limited number are seen during each clinic.   [facility]  [address] Ether Griffins [place], [place] [phone]   Eligibility Requirements You must have lived in [place], [place], or [place] counties for at least the last three months.   You cannot be eligible for state or federal sponsored [place], including [employer], [place], or [employer].   You generally cannot be eligible for healthcare insurance through your employer.    How to apply: Eligibility screenings are held every Tuesday and Wednesday afternoon from 1:00 pm until 4:00 pm. You do not need an appointment for the interview!  [facility] [address], [place], [place] [phone]   [facility] Health Department  [phone]   [facility] Health Department  [phone]   [facility] Health Department  [phone]    Behavioral Health Resources in the Community: Intensive Outpatient Programs Organization  Address  Phone  Notes  [place] [address], [place], [place] [phone]   [facility] Outpatient [address] [place], [place]   ADS: [facility] [address] [place], [place]   [place] [address]  [place], [place] or [phone]   Substance Abuse Resources Organization         Address  Phone  Notes  [facility] Drug Services  [phone]   [place]  [phone]   [facility] [place]   Chinita Pester  [phone]   Residential & Outpatient Substance Abuse Program  [phone]   Psychological Services Organization         Address  Phone  Notes  [facility] [place]  [place]  [phone]   [place] [address], [place] or [phone]    Mobile Crisis Teams Organization         Address  Phone  Notes  [facility], Mobile Crisis Care Unit  [phone]   [facility]  [address] [place], [place]   [place] [address], [place] [phone]    Self-Help/Support Groups Organization         Address  Phone             Notes  [place]. of [place] - variety of support groups  [place] Call for more information  Narcotics Anonymous (NA), [facility] [address], [employer] [place]  2 meetings at this location   Special educational needs teacher         Address  Phone  Notes  [facility] [place],    [place]  [phone]   [facility]  [address] [place] [address], [place], [place]   [place], [place] [phone] Admissions: 8am-3pm M-F  Incentives Substance [place] [address]    [place], [place] [phone]   [facility] [address] [place],  [place], [place]   [facility] [address],  [place], [place]   [facility] - Intensive Outpatient [address] Kristeen [address], [place], [place]   [facility] ([place] [place]  [place], [place] [phone] or [phone]   [facility] ([facility]) [address] [place], [place] Accepts Medicaid  [facility] [place] [address],  [place] [phone] Substance Abuse/Addiction Treatment   [facility] Organization         Address  Phone  Notes  [facility]  [phone]   Domenic Schwab, PhD [address] Arlis Porta [place], [place]   [phone] or [phone]   [place]   [address] [place], [place] [phone]   [place] [address] [place], [place] [phone] Insurance/Medicaid/sponsorship through [facility] and Families [address] [place]                                    [place], [place] [phone] [place] [address], [place] [phone]    Dr. Adele Schilder  [phone]   [place]  [place] [address] 1) [address] [employer] 2) [place] 3)  [address], [place] [phone]  [phone]   [place] [phone] or [phone] (After Hours)

## 2013-11-23 ENCOUNTER — Emergency Department (HOSPITAL_COMMUNITY)
Admission: EM | Admit: 2013-11-23 | Discharge: 2013-11-23 | Disposition: A | Discharge status: Home / Self Care | Payer: Self-pay | Attending: Emergency Medicine | Admitting: Emergency Medicine

## 2013-11-23 ENCOUNTER — Encounter (HOSPITAL_COMMUNITY): Payer: Self-pay | Admitting: Emergency Medicine

## 2013-11-23 DIAGNOSIS — L03213 Periorbital cellulitis: Secondary | ICD-10-CM

## 2013-11-23 DIAGNOSIS — H05011 Cellulitis of right orbit: Secondary | ICD-10-CM | POA: Insufficient documentation

## 2013-11-23 DIAGNOSIS — Z872 Personal history of diseases of the skin and subcutaneous tissue: Secondary | ICD-10-CM | POA: Insufficient documentation

## 2013-11-23 DIAGNOSIS — Z720 Tobacco use: Secondary | ICD-10-CM | POA: Insufficient documentation

## 2013-11-23 MED ORDER — CLINDAMYCIN HCL 150 MG PO CAPS
450.0000 mg | ORAL_CAPSULE | Freq: Once | ORAL | Status: AC
  Administered 2013-11-23: 450 mg via ORAL
  Filled 2013-11-23: qty 3

## 2013-11-23 MED ORDER — CLINDAMYCIN HCL 150 MG PO CAPS: 450.0000 mg | ORAL_CAPSULE | Freq: Three times a day (TID) | ORAL | Status: AC

## 2013-11-23 NOTE — ED Notes (Signed)
Declined W/C at D/C and was escorted to lobby by RN. 

## 2013-11-23 NOTE — ED Provider Notes (Signed)
CSN: [id number]     Arrival date & time 11/23/13  1108 History  This chart was scribed for non-physician practitioner, Harle BattiestElizabeth Nichlas Pitera, NP working with Juliet RudeNathan R. Rubin PayorPickering, MD by Greggory StallionKayla Andersen, ED scribe. This patient was seen in room TR05C/TR05C and the patient's care was started at 11:55 AM.   Chief Complaint  Patient presents with  . Facial Swelling   The history is provided by the patient. No language interpreter was used.    HPI Comments: Seth Anderson is a 23 y.o. male who presents to the Emergency Department complaining of right eye swelling that started 2 days ago. Denies drainage. Denies injury. Reports increased pain and swelling around his eye. Describes pain as constant and throbbing and rates it 6/10. Denies history of the same. He has done warm compresses with no relief. Denies fever, chills, eye pain, eye drainage, visual changes, headache.   Past Medical History  Diagnosis Date  . Boil of buttock    History reviewed. No pertinent past surgical history. Family History  Problem Relation Age of Onset  . Diabetes Mother    History  Substance Use Topics  . Smoking status: Current Every Day Smoker -- 0.50 packs/day  . Smokeless tobacco: Not on file     Comment: states he will cut back  . Alcohol Use: Yes     Comment: socially    Review of Systems  Constitutional: Negative for fever and chills.  HENT: Positive for facial swelling.   Eyes: Negative for pain, discharge and visual disturbance.  Neurological: Negative for headaches.  All other systems reviewed and are negative.  Allergies  Review of patient's allergies indicates no known allergies.  Home Medications   Prior to Admission medications   Medication Sig Start Date End Date Taking? Authorizing Provider  HYDROcodone-acetaminophen (NORCO/VICODIN) 5-325 MG per tablet Take 1 tablet by mouth every 6 (six) hours as needed for moderate pain or severe pain. 06/23/13   Marissa Sciacca, PA-C  ibuprofen  (ADVIL,MOTRIN) 600 MG tablet Take 1 tablet (600 mg total) by mouth every 8 (eight) hours as needed for pain. 11/06/12   Nishant Dhungel, MD  ibuprofen (ADVIL,MOTRIN) 600 MG tablet Take 1 tablet (600 mg total) by mouth every 6 (six) hours as needed. 06/23/13   Marissa Sciacca, PA-C   BP 138/73 mmHg  Pulse 89  Temp(Src) 98.1 F (36.7 C) (Oral)  Resp 18  SpO2 98%   Physical Exam  Constitutional: He is oriented to person, place, and time. He appears well-developed and well-nourished. No distress.  HENT:  Head: Normocephalic and atraumatic.  Eyes: Conjunctivae and EOM are normal.  EOMs intact and nonpainful.   Neck: Normal range of motion. Neck supple. No tracheal deviation present.  Cardiovascular: Normal rate.   Pulmonary/Chest: Effort normal. No respiratory distress.  Musculoskeletal: Normal range of motion.  Neurological: He is alert and oriented to person, place, and time.  Skin: Skin is warm and dry.  1.5 cm in diameter area of induration and erythema to Rt eye outer canthus. Cellulitis to Rt upper eyelid.   Psychiatric: He has a normal mood and affect. His behavior is normal.  Nursing note and vitals reviewed.   ED Course  Procedures (including critical care time)  DIAGNOSTIC STUDIES: Oxygen Saturation is 98% on RA, normal by my interpretation.    COORDINATION OF CARE: 11:58 AM-Discussed treatment plan which includes antibiotics with pt at bedside and pt agreed to plan.   Labs Review Labs Reviewed - No data to display  Imaging Review No results found.   EKG Interpretation None      MDM   Final diagnoses:  Periorbital cellulitis of right eye   23 yo male with report of picking at blemish at rt outer canthus and today with area of induration and swelling extending to rt upper eyelid.  Pt eye exam shows no changes in vision and pain free EOM without any signs of entrapment. Presentation consistent with pre-septal cellulitis. Pt given dose of clindamycin here and dc  with prescription and instructions to return in 2 days for re-check. Strict return precautions if symptoms worsen or change. Pt aware of plan and in agreement.  I personally performed the services described in this documentation, which was scribed in my presence. The recorded information has been reviewed and is accurate.  Filed Vitals:   11/23/13 1123 11/23/13 1229  BP: 138/73 130/68  Pulse: 89 71  Temp: 98.1 F (36.7 C) 97.9 F (36.6 C)  TempSrc: Oral Oral  Resp: 18 16  SpO2: 98% 100%   Meds given in ED:  Medications  clindamycin (CLEOCIN) capsule 450 mg (450 mg Oral Given 11/23/13 1235)    Discharge Medication List as of 11/23/2013 12:19 PM    START taking these medications   Details  clindamycin (CLEOCIN) 150 MG capsule Take 3 capsules (450 mg total) by mouth 3 (three) times daily., Starting 11/23/2013, Until Wed 12/02/13, Print          Harle BattiestElizabeth Tanor Glaspy, NP 11/23/13 1934

## 2013-11-23 NOTE — ED Notes (Signed)
Pt c/o right eye swelling with possible abscess x 2 days

## 2013-11-23 NOTE — Discharge Instructions (Signed)
Please follow directions provided. Please take your antibiotics as prescribed. Be sureure to return in 3-4 days for a follow-up appointment to ensure the swelling is improving. You may establish care with your primary doctor for this appointment or you may return here. Don't hesitate to return for any new, worsening, or concerning symptoms.  SEEK IMMEDIATE MEDICAL CARE IF:  Your eyelids become more painful, red, warm, or swollen.  You develop double vision or your vision becomes blurred or worsens in any way.  You have trouble moving your eyes.  The eye looks like it is popping out (proptosis).  You develop a severe headache, severe neck pain, or neck stiffness.  You develop repeated vomiting.  You have a fever or persistent symptoms for more than 72 hours.  You have a fever and your symptoms suddenly get worse.    Emergency Department Resource Guide 1) Find a Doctor and Pay Out of Pocket Although you won't have to find out who is covered by your insurance plan, it is a good idea to ask around and get recommendations. You will then need to call the office and see if the doctor you have chosen will accept you as a new patient and what types of options they offer for patients who are self-pay. Some doctors offer discounts or will set up payment plans for their patients who do not have insurance, but you will need to ask so you aren't surprised when you get to your appointment.  2) Contact Your Local Health Department Not all health departments have doctors that can see patients for sick visits, but many do, so it is worth a call to see if yours does. If you don't know where your local health department is, you can check in your phone book. The CDC also has a tool to help you locate your state's health department, and many state websites also have listings of all of their local health departments.  3) Find a Walk-in Clinic If your illness is not likely to be very severe or complicated, you may  want to try a walk in clinic. These are popping up all over the country in pharmacies, drugstores, and shopping centers. They're usually staffed by nurse practitioners or physician assistants that have been trained to treat common illnesses and complaints. They're usually fairly quick and inexpensive. However, if you have serious medical issues or chronic medical problems, these are probably not your best option.  No Primary Care Doctor: - Call Health Connect at  [phone] - they can help you locate a primary care doctor that  accepts your insurance, provides certain services, etc. - Physician Referral Service- [phone]  Chronic Pain Problems: Organization         Address  Phone   Notes  [facility]  [phone] Patients need to be referred by their primary care doctor.   Medication Assistance: Organization         Address  Phone   Notes  [facility] [address] [place], [place] [address] [phone] --Must be a resident of [facility] [place] -- Must have NO insurance coverage whatsoever (no Medicaid/ Medicare, etc.) -- The pt. MUST have a primary care doctor that directs their care regularly and follows them in the community   MedAssist  [phone]   [employer]  [phone]    Agencies that provide inexpensive medical care: Retail buyerrganization         Address  Phone  Notes  [facility]  [phone]   [facility]    [phone]   [facility] [address] [place], [place] [address] [phone]   [facility] [address] [place], [place] [phone]   Planned Parenthood    [phone]   [facility]    [phone]   [facility] and [facility]  [address], [place] Phone:  [phone], Fax:  [phone] Hours of Operation:  9 am - 6 pm, M-F.  Also accepts Medicaid/Medicare and self-pay.   [facility]  [address], [place] Phone: [phone], Fax: [phone]. Hours of Operation:  8:30 am - 5:30 pm, M-F.  Also accepts Medicaid and self-pay.  [facility] [place] [address] [place] Phone: [phone]   [facility] [address] Natasha Bence [place], [place] [phone], Ext. [phone] Mondays & Thursdays: 7-9 AM.  First 15 patients are seen on a first come, first serve basis.    Medicaid-accepting [facility] Providers:  Organization         Address  Phone   Notes  [facility] [address], [place] [phone] Also accepts self-pay patients.  [facility] [address] Laurell Josephs [place], [place]  [phone]   [facility] [address], [place] [phone]   [facility] [address], [place] [phone]   Renaye Rakers [address], [place]   [phone] Only accepts [place] patients after they have their name applied to their card.   Self-Pay (no insurance) in [facility]:  Organization         Address  Phone   Notes  Sickle Cell Patients, [facility] [address] [place], [place] [phone]   [facility] [address] [place], [place] [phone]   [facility] [place]  [address], [place] [phone]   [facility]/Dr. Osei-Bonsu  [address] [place] or [address], [place] [phone] Phone number for both [place] and [place] locations is the same.  [facility] [address] [place] [phone]   [facility] [address] [place] or [address] [phone]   [facility] [address] [place] [phone], phone; [phone], fax Sees patients 1st and 3rd Saturday of every month.  Must not qualify for public or private insurance (i.e. Medicaid, Medicare, [place] Health Choice, Veterans' Benefits)  Household income should be no more than 200% of the poverty level The clinic cannot treat you if you are pregnant or think you are pregnant  Sexually transmitted diseases are not treated at the clinic.    Dental Care: Organization         Address  Phone  Notes  [facility] Department of [facility] [address] [place], [place] [phone] Accepts children up to age 53 who are enrolled in [place] or [place] Health Choice; pregnant women with a Medicaid card; and children who have applied for Medicaid or [place] Health Choice, but were declined, whose parents can pay a reduced fee at time of service.  [facility]  Department of [facility] [place]  [address], [place] [phone] Accepts children up to age 24 who are enrolled in [place] or [place] Health Choice; pregnant women with a Medicaid card; and children who have applied for Medicaid or [place] Health Choice, but were declined, whose parents can pay a reduced fee at time of service.  [facility]  [address], [place] [phone] Patients are seen by appointment only. Walk-ins are not accepted. [facility] will see patients 918 years of age and older. Monday - Tuesday (8am-5pm) Most Wednesdays (8:30-5pm) $30 per visit, cash only  [facility]  [address], [place] [phone] Patients are seen by appointment only. Walk-ins are not accepted. [facility] will see patients 24 years of age and older. One Wednesday Evening (Monthly: Volunteer Based).  $30 per visit, cash only  [employer] [facility] [facility]  [phone] for adults; Children under age 414, call Graduate Pediatric Dentistry at [phone]. Children aged 294-14, please call [phone]  [phone] to request a pediatric application.  Dental services are provided in all areas of dental care including fillings, crowns and bridges, complete and partial dentures, implants, gum treatment, root canals, and extractions. Preventive care is also provided. Treatment is provided to both adults and children. Patients are selected via a lottery and there is often a waiting list.   [facility] [address], [place]  [phone] [email or web address]   [facility] [address], [place], [place] [phone], Ext. [phone] Second and Fourth Thursday of each month, opens at 6:30 AM; Clinic ends at 9 AM.  Patients are seen on a first-come first-served basis, and a limited number are seen during each clinic.   [facility]  [address], [place], [place] [phone]   Eligibility Requirements You must have lived in [place], [place], or [place] counties for at least the last three months.   You cannot be eligible for state or federal sponsored National Cityhealthcare insurance, including CIGNAVeterans Administration, [place]icaid, or [employer]Medicare.   You generally cannot be eligible for healthcare insurance through your employer.    How to apply: Eligibility screenings are held every Tuesday and Wednesday afternoon from 1:00 pm until 4:00 pm. You do not need an appointment for the interview!  [facility] [address], [place], [place] [phone]   [facility]  [phone]   [facility]  [phone]   [facility]  [phone]    Behavioral Health Resources in the Community: Intensive Outpatient Programs Organization         Address  Phone  Notes  [facility] [address], [place], [place] [phone]   [facility] [address], [place], [place] [phone]   [facility] [address], [place], [place]  [phone]    [facility] [address],  [place], [place] [phone] or [phone]   Substance Abuse Resources Organization         Address  Phone  Notes  [facility]  [phone]   [facility]  [phone]   [facility]  [phone]   [facility]  [phone]   Residential & Outpatient Substance Abuse Program  [phone]   Psychological Services Organization         Address  Phone  Notes  [facility]  [phone]- [phone]   [facility]  [phone]- [phone]   [facility] [address], [place] [phone] or [phone]    Mobile Crisis Teams Organization         Address  Phone  Notes  [facility], Mobile Crisis Care Unit  [phone]   [facility]  [address] [place], [place] [phone]   Doristine LocksSharon DeEsch [address] [place] [phone]    Self-Help/Support Groups Organization         Address  Phone             Notes  [facility] [place] - variety of support groups  [phone] Call for more information  Narcotics Anonymous (NA), [facility]aring [facility] [address], [place]  2 meetings at this location   Statisticianesidential Treatment Programs Organization         Address  Phone  Notes  [facility] [address],    [place]  [phone]   [facility]  [address], [place] [phone], [place], [place] [phone]   [facility] [address], [place] [phone] Admissions: 8am-3pm M-F  [facility] [address]    [place], [place] [phone]   [facility] [address], [place], [place] [phone]   [facility] [address]  [place], [place] [phone]   [facility] - Intensive Outpatient [address], [place], [place] [phone]   [facility] ([facility] [address]  [place], [place] [phone] or [phone]   [facility] ([facility]) [address] [place], [place] [phone] Accepts Medicaid  [facility] [address]  [place] [phone] Substance Abuse/Addiction Treatment   [facility]al Health Resources Organization         Address  Phone  Notes  [facility]  [phone]   Angie FavaJulie Brannon, PhD [address], [address] [place], [place]   [phone] or [phone]   [facility]   [address] [place], [place] [phone]   [facility] [address], [place], [place] [phone] Insurance/Medicaid/sponsorship through [facility]  [facility] [address]                                    [place], [place] [phone] Therapy/tele-psych/case  [facility] [address]   [place], [place] [phone]    Dr. Lolly MustacheArfeen  [phone]   [facility]  [employer] [facility] 1) [address], [place] 2) [address], [place] 3)  [address], [place] [phone]  [phone]   [facility] Child Abuse Hotline [phone]) [phone] or [phone](336) [phone] (After Hours)

## 2013-11-25 ENCOUNTER — Emergency Department (HOSPITAL_COMMUNITY): Payer: Self-pay

## 2013-11-25 ENCOUNTER — Emergency Department (HOSPITAL_COMMUNITY)
Admission: EM | Admit: 2013-11-25 | Discharge: 2013-11-25 | Disposition: A | Discharge status: Home / Self Care | Payer: Self-pay | Attending: Emergency Medicine | Admitting: Emergency Medicine

## 2013-11-25 ENCOUNTER — Encounter (HOSPITAL_COMMUNITY): Payer: Self-pay

## 2013-11-25 DIAGNOSIS — Z720 Tobacco use: Secondary | ICD-10-CM | POA: Insufficient documentation

## 2013-11-25 DIAGNOSIS — Z872 Personal history of diseases of the skin and subcutaneous tissue: Secondary | ICD-10-CM | POA: Insufficient documentation

## 2013-11-25 DIAGNOSIS — H44001 Unspecified purulent endophthalmitis, right eye: Secondary | ICD-10-CM | POA: Insufficient documentation

## 2013-11-25 DIAGNOSIS — L0390 Cellulitis, unspecified: Secondary | ICD-10-CM

## 2013-11-25 MED ORDER — IOHEXOL 300 MG/ML  SOLN
80.0000 mL | Freq: Once | INTRAMUSCULAR | Status: AC | PRN
  Administered 2013-11-25: 80 mL via INTRAVENOUS

## 2013-11-25 MED ORDER — DOXYCYCLINE HYCLATE 100 MG PO CAPS: 100.0000 mg | ORAL_CAPSULE | Freq: Two times a day (BID) | ORAL | Status: DC

## 2013-11-25 NOTE — ED Notes (Signed)
To CT scan

## 2013-11-25 NOTE — Discharge Instructions (Signed)
Abscess An abscess is an infected area that contains a collection of pus and debris.It can occur in almost any part of the body. An abscess is also known as a furuncle or boil. CAUSES  An abscess occurs when tissue gets infected. This can occur from blockage of oil or sweat glands, infection of hair follicles, or a minor injury to the skin. As the body tries to fight the infection, pus collects in the area and creates pressure under the skin. This pressure causes pain. People with weakened immune systems have difficulty fighting infections and get certain abscesses more often.  SYMPTOMS Usually an abscess develops on the skin and becomes a painful mass that is red, warm, and tender. If the abscess forms under the skin, you may feel a moveable soft area under the skin. Some abscesses break open (rupture) on their own, but most will continue to get worse without care. The infection can spread deeper into the body and eventually into the bloodstream, causing you to feel ill.  DIAGNOSIS  Your caregiver will take your medical history and perform a physical exam. A sample of fluid may also be taken from the abscess to determine what is causing your infection. TREATMENT  Your caregiver may prescribe antibiotic medicines to fight the infection. However, taking antibiotics alone usually does not cure an abscess. Your caregiver may need to make a small cut (incision) in the abscess to drain the pus. In some cases, gauze is packed into the abscess to reduce pain and to continue draining the area. HOME CARE INSTRUCTIONS   Only take over-the-counter or prescription medicines for pain, discomfort, or fever as directed by your caregiver.  If you were prescribed antibiotics, take them as directed. Finish them even if you start to feel better.  If gauze is used, follow your caregiver's directions for changing the gauze.  To avoid spreading the infection:  Keep your draining abscess covered with a  bandage.  Wash your hands well.  Do not share personal care items, towels, or whirlpools with others.  Avoid skin contact with others.  Keep your skin and clothes clean around the abscess.  Keep all follow-up appointments as directed by your caregiver. SEEK MEDICAL CARE IF:   You have increased pain, swelling, redness, fluid drainage, or bleeding.  You have muscle aches, chills, or a general ill feeling.  You have a fever. MAKE SURE YOU:   Understand these instructions.  Will watch your condition.  Will get help right away if you are not doing well or get worse. Document Released: 10/18/2004 Document Revised: 07/10/2011 Document Reviewed: 03/23/2011 [facility] Patient Information 2015 [place], [place]. This information is not intended to replace advice given to you by your health care provider. Make sure you discuss any questions you have with your health care provider.   You were evaluated today in the ED for your right eye abscess. Your abscess was drained at the bedside. Your CT scan showed the abscess is only superficial and does not invade your eye. Please take your new antibiotic until it is gone even if you feel better. You may follow-up with your primary care in 3 days for reevaluation of your wound.

## 2013-11-25 NOTE — ED Notes (Signed)
Pt presents with Right eye swelling and redness starting 11/21/2013. Pt states "it feels like it is leaking inside my eye." Pt seen here Monday, diagnosed with cellulitis and prescribed ABX. Pt taking his ABX with no relief. Pt is unable to open his eye, Right eye is completely closed

## 2013-11-25 NOTE — ED Provider Notes (Signed)
CSN: [id number]     Arrival date & time 11/25/13  0227 History   None    Chief Complaint  Patient presents with  . Cellulitis     (Consider location/radiation/quality/duration/timing/severity/associated sxs/prior Treatment) HPI Wendy PoetMichael D Vanlanen is a 23 y.o. male with no significant past medical history comes in for evaluation of eye infection. Patient states he had a zit on the outside of his right eye that started on Saturday, he picked at it and it got worse which prompted him to come here for evaluation on Monday. He was diagnosed with a preseptal cellulitis and discharged with clindamycin. He has been taking the clindamycin but is not experiencing relief and feels like the wound is increasing in size. He does report an associated discharge from his eye.  He denies any fevers, chills, painful eye movements, changes in vision, headache, rhinorrhea.  Past Medical History  Diagnosis Date  . Boil of buttock    History reviewed. No pertinent past surgical history. Family History  Problem Relation Age of Onset  . Diabetes Mother    History  Substance Use Topics  . Smoking status: Current Every Day Smoker -- 0.50 packs/day  . Smokeless tobacco: Not on file     Comment: states he will cut back  . Alcohol Use: Yes     Comment: socially    Review of Systems  Constitutional: Negative for fever.  HENT: Negative for rhinorrhea, sinus pressure and sore throat.   Eyes: Positive for pain and discharge. Negative for visual disturbance.  Respiratory: Negative for shortness of breath.   Cardiovascular: Negative for chest pain.  Gastrointestinal: Negative for abdominal pain.  Endocrine: Negative for polyuria.  Genitourinary: Negative for dysuria.  Skin: Negative for rash.  Neurological: Negative for headaches.      Allergies  Review of patient's allergies indicates no known allergies.  Home Medications   Prior to Admission medications   Medication Sig Start Date End Date Taking?  Authorizing Provider  clindamycin (CLEOCIN) 150 MG capsule Take 3 capsules (450 mg total) by mouth 3 (three) times daily. 11/23/13 12/02/13 Yes Harle BattiestElizabeth Tysinger, NP  HYDROcodone-acetaminophen (NORCO/VICODIN) 5-325 MG per tablet Take 1 tablet by mouth every 6 (six) hours as needed for moderate pain or severe pain. 06/23/13   Marissa Sciacca, PA-C  ibuprofen (ADVIL,MOTRIN) 600 MG tablet Take 1 tablet (600 mg total) by mouth every 8 (eight) hours as needed for pain. 11/06/12   Nishant Dhungel, MD  ibuprofen (ADVIL,MOTRIN) 600 MG tablet Take 1 tablet (600 mg total) by mouth every 6 (six) hours as needed. 06/23/13   Marissa Sciacca, PA-C   BP 113/66 mmHg  Pulse 68  Temp(Src) 98.9 F (37.2 C) (Oral)  Resp 28  Ht 5\' 8"  (1.727 m)  Wt 252 lb (114.306 kg)  BMI 38.33 kg/m2  SpO2 98% Physical Exam  Constitutional: He is oriented to person, place, and time. He appears well-developed and well-nourished.  HENT:  Head: Normocephalic and atraumatic.  Mouth/Throat: Oropharynx is clear and moist.  Eyes: Conjunctivae are normal. Pupils are equal, round, and reactive to light. Left eye exhibits no discharge. No scleral icterus.    Right eye lateral canthus appears to have an abscess approximately 2 cm in diameter with fluctuance and surrounding induration. There is some periorbital swelling that is nontender and nonerythematous. No evidence of proptosis. Mild right eye discharge that is not purulent. Extraocular movements are intact and completed without any discomfort.  Neck: Neck supple.  Cardiovascular: Normal rate, regular rhythm and normal  heart sounds.   Pulmonary/Chest: Effort normal and breath sounds normal. No respiratory distress. He has no wheezes. He has no rales.  Abdominal: Soft. There is no tenderness.  Musculoskeletal: He exhibits no tenderness.  Neurological: He is alert and oriented to person, place, and time.  Cranial Nerves II-XII grossly intact  Skin: Skin is warm and dry. No rash noted.    Psychiatric: He has a normal mood and affect.  Nursing note and vitals reviewed.  INCISION AND DRAINAGE Performed by: Sharlene Mottsartner, Gabby Rackers W Consent: Verbal consent obtained. Risks and benefits: risks, benefits and alternatives were discussed Type: abscess  Body area: right eye lateral canthus  Anesthesia: local infiltration  Incision was made with a scalpel.  Local anesthetic: none  Anesthetic total: 0 ml  Complexity: complex Blunt dissection to break up loculations  Drainage: purulent  Drainage amount: 1 cc pus  Packing material: none  Patient tolerance: Patient tolerated the procedure well with no immediate complications.    ED Course  Procedures (including critical care time) Labs Review Labs Reviewed - No data to display  Imaging Review No results found.   EKG Interpretation None     Meds given in ED:  Medications  iohexol (OMNIPAQUE) 300 MG/ML solution 80 mL (80 mLs Intravenous Contrast Given 11/25/13 0802)    New Prescriptions   No medications on file   Filed Vitals:   11/25/13 0700 11/25/13 0715 11/25/13 0753 11/25/13 0828  BP:   124/61 116/67  Pulse: 70 72 74 65  Temp:      TempSrc:      Resp: 21 27 24 21   Height:      Weight:      SpO2: 100% 98% 98% 99%    MDM  Vitals stable - WNL -afebrile Pt resting comfortably in ED.  Abscess of right lateral canthus drained at bedside with approximately 1 mL of pus expelled. Patient states he feels much better after procedure PE not concerning for other acute or emergent pathology. Extraocular movements intact without any pain. Surrounding periorbital edema is nontender. No evidence of proptosis.  Imaging- CT Maxillofacial shows preseptal and periorbital cellulitis.   Will DC with doxycycline. Change from original clindamycin antibiotic Discussed f/u with PCP and return precautions, pt very amenable to plan. Patient stable, in good condition and is appropriate for discharge   Prior to patient  discharge, I discussed and reviewed this case with Dr.Docherty    Final diagnoses:  Cellulitis        Sharlene MottsBenjamin W Jackqueline Aquilar, PA-C 11/25/13 [id number]  Tomasita CrumbleAdeleke Oni, MD 11/25/13 2141

## 2013-11-25 NOTE — ED Notes (Signed)
Patient returned from CT

## 2013-11-25 NOTE — ED Notes (Signed)
PA at BS.  

## 2015-09-03 ENCOUNTER — Emergency Department (HOSPITAL_COMMUNITY)
Admission: EM | Admit: 2015-09-03 | Discharge: 2015-09-03 | Disposition: A | Discharge status: Home / Self Care | Payer: Self-pay | Attending: Emergency Medicine | Admitting: Emergency Medicine

## 2015-09-03 ENCOUNTER — Encounter (HOSPITAL_COMMUNITY): Payer: Self-pay | Admitting: Emergency Medicine

## 2015-09-03 DIAGNOSIS — F17200 Nicotine dependence, unspecified, uncomplicated: Secondary | ICD-10-CM | POA: Insufficient documentation

## 2015-09-03 DIAGNOSIS — R55 Syncope and collapse: Secondary | ICD-10-CM | POA: Insufficient documentation

## 2015-09-03 LAB — CBC WITH DIFFERENTIAL/PLATELET
BASOS PCT: 1 %
Basophils Absolute: 0 10*3/uL (ref 0.0–0.1)
EOS ABS: 0 10*3/uL (ref 0.0–0.7)
EOS PCT: 0 %
HCT: 43.8 % (ref 39.0–52.0)
Hemoglobin: 14.9 g/dL (ref 13.0–17.0)
Lymphocytes Relative: 36 %
Lymphs Abs: 2.4 10*3/uL (ref 0.7–4.0)
MCH: 28.4 pg (ref 26.0–34.0)
MCHC: 34 g/dL (ref 30.0–36.0)
MCV: 83.4 fL (ref 78.0–100.0)
MONO ABS: 0.8 10*3/uL (ref 0.1–1.0)
MONOS PCT: 12 %
Neutro Abs: 3.5 10*3/uL (ref 1.7–7.7)
Neutrophils Relative %: 51 %
Platelets: 237 10*3/uL (ref 150–400)
RBC: 5.25 MIL/uL (ref 4.22–5.81)
RDW: 13.8 % (ref 11.5–15.5)
WBC: 6.8 10*3/uL (ref 4.0–10.5)

## 2015-09-03 LAB — BASIC METABOLIC PANEL
Anion gap: 9 (ref 5–15)
BUN: 10 mg/dL (ref 6–20)
CALCIUM: 9.1 mg/dL (ref 8.9–10.3)
CO2: 24 mmol/L (ref 22–32)
CREATININE: 1.09 mg/dL (ref 0.61–1.24)
Chloride: 103 mmol/L (ref 101–111)
GFR calc non Af Amer: >60 mL/min (ref >60)
GLUCOSE: 114 mg/dL — AB (ref 65–99)
Potassium: 3.2 mmol/L — ABNORMAL LOW (ref 3.5–5.1)
Sodium: 136 mmol/L (ref 135–145)

## 2015-09-03 LAB — I-STAT TROPONIN, ED: TROPONIN I, POC: 0 ng/mL (ref 0.00–0.08)

## 2015-09-03 MED ORDER — SODIUM CHLORIDE 0.9 % IV BOLUS (SEPSIS)
500.0000 mL | Freq: Once | INTRAVENOUS | Status: AC
  Administered 2015-09-03: 500 mL via INTRAVENOUS

## 2015-09-03 NOTE — Discharge Instructions (Signed)

## 2015-09-03 NOTE — ED Provider Notes (Signed)
Emergency Department Provider Note   I have reviewed the triage vital signs and the nursing notes.   HISTORY  Chief Complaint Loss of Consciousness   HPI Seth Anderson is a 25 y.o. male with no significant PMH presents to the emergency department for evaluation of a syncopal event today. The patient was standing beside his car when he received very bad news. He states he became emotionally very upset and began to feel lightheaded. He felt wobbly and then loss consciousness. Friend at bedside states that he was confused for approximately 3 minutes but seemed to regain consciousness shortly after falling. No head trauma. No vomiting or obvious seizure activity on scene. Patient denies any heart palpitations or chest discomfort prior to syncope. He does have some chest soreness on the left side after a syncopal event. Ports that it is improving since he initially felt it. No fevers, chills. The patient did not eat breakfast this morning and states he is feeling dehydrated. No family history of sudden cardiac death.   Past Medical History:  Diagnosis Date  . Boil of buttock     Patient Active Problem List   Diagnosis Date Noted  . Back pain, chronic 11/06/2012    History reviewed. No pertinent surgical history.    Allergies Review of patient's allergies indicates no known allergies.  Family History  Problem Relation Age of Onset  . Diabetes Mother     Social History Social History  Substance Use Topics  . Smoking status: Current Every Day Smoker    Packs/day: 0.50  . Smokeless tobacco: Never Used     Comment: states he will cut back  . Alcohol use Yes    Review of Systems  Constitutional: No fever/chills Eyes: No visual changes. ENT: No sore throat. Cardiovascular: Positive chest pain and syncope.  Respiratory: Denies shortness of breath. Gastrointestinal: No abdominal pain.  No nausea, no vomiting.  No diarrhea.  No constipation. Genitourinary: Negative for  dysuria. Musculoskeletal: Negative for back pain. Skin: Negative for rash. Neurological: Negative for headaches, focal weakness or numbness.  10-point ROS otherwise negative.  ____________________________________________   PHYSICAL EXAM:  VITAL SIGNS: ED Triage Vitals [09/03/15 1004]  Enc Vitals Group     BP 140/76     Pulse Rate 72     Resp 18     SpO2 100 %     Height  (1.727 m)   Constitutional: Alert and oriented. Intermittently tearful during exam.  Eyes: Conjunctivae are normal. PERRL. EOMI. Head: Atraumatic. Nose: No congestion/rhinnorhea. Mouth/Throat: Mucous membranes are moist.  Oropharynx non-erythematous. Neck: No stridor.   Cardiovascular: Normal rate, regular rhythm. Good peripheral circulation. Grossly normal heart sounds.   Respiratory: Normal respiratory effort.  No retractions. Lungs CTAB. Gastrointestinal: Soft and nontender. No distention.  Musculoskeletal: No lower extremity tenderness nor edema. No gross deformities of extremities. Neurologic:  Normal speech and language. No gross focal neurologic deficits are appreciated. No CN exam.  Skin:  Skin is warm, dry and intact. No rash noted. Psychiatric: Mood and affect are normal. Speech and behavior are normal.  ____________________________________________   LABS (all labs ordered are listed, but only abnormal results are displayed)  Labs Reviewed  BASIC METABOLIC PANEL - Abnormal; Notable for the following:       Result Value   Potassium 3.2 (*)    Glucose, Bld 114 (*)    All other components within normal limits  CBC WITH DIFFERENTIAL/PLATELET  I-STAT TROPOININ, ED   ____________________________________________  EKG  Reviewed in MUSE. No STEMI or arrhythmia.  ____________________________________________  RADIOLOGY  None ____________________________________________   PROCEDURES  Procedure(s) performed:    Procedures  None ____________________________________________   INITIAL IMPRESSION / ASSESSMENT AND PLAN / ED COURSE  Pertinent labs & imaging results that were available during my care of the patient were reviewed by me and considered in my medical decision making (see chart for details).  Patient presents to the emergency department for evaluation of syncope. The patient reports receiving very emotionally upsetting news prior to syncope. No head trauma. The patient is returned to mental status baseline. No evidence of seizure either by report or stigmata on exam. Patient is having some mild chest discomfort after the episode. None before. Plan for screening labs and troponin. We'll obtain orthostatic vital signs and give IV fluids. Patient does seem emotionally distraught at this time. We'll reassess after IV fluids and labs. Very low risk for ACS by HEART score and low risk DVT/PE on Well's.   12:26 PM Patient is feeling much better. Labs are unremarkable. EKG reviewed and unremarkable. Extremely low suspicion for cardiogenic cause of syncope. Patient is not having any chest pain at this time. Advised that he should return to the emergency department if chest pain returns or he has additional syncope. Patient not orthostatic. Plan for discharge at this time with primary care physician follow-up. ____________________________________________  FINAL CLINICAL IMPRESSION(S) / ED DIAGNOSES  Final diagnoses:  Syncope and collapse     MEDICATIONS GIVEN DURING THIS VISIT:  Medications  sodium chloride 0.9 % bolus 500 mL (0 mLs Intravenous Stopped 09/03/15 1141)     NEW OUTPATIENT MEDICATIONS STARTED DURING THIS VISIT:  There are no discharge medications for this patient.     Note:  This document was prepared using Dragon voice recognition software and may include unintentional dictation errors.  Alona BeneJoshua Griselle Rufer, MD Emergency Medicine  Maia PlanJoshua G Dejai Schubach, MD 09/03/15 [phone]

## 2015-09-03 NOTE — ED Triage Notes (Signed)
Patient states that he was standing beside car talking about 30 mins ago when he got dizzy and passed out.  Patient denies having eating anything today and was feeling fine before then.

## 2015-09-03 NOTE — ED Notes (Signed)
Pt given OJ- declined food

## 2016-12-06 ENCOUNTER — Encounter (HOSPITAL_COMMUNITY): Payer: Self-pay | Admitting: Emergency Medicine

## 2016-12-06 ENCOUNTER — Other Ambulatory Visit: Payer: Self-pay

## 2016-12-06 ENCOUNTER — Emergency Department (HOSPITAL_COMMUNITY)
Admission: EM | Admit: 2016-12-06 | Discharge: 2016-12-06 | Disposition: A | Discharge status: Home / Self Care | Payer: Self-pay | Attending: Emergency Medicine | Admitting: Emergency Medicine

## 2016-12-06 DIAGNOSIS — F17200 Nicotine dependence, unspecified, uncomplicated: Secondary | ICD-10-CM | POA: Insufficient documentation

## 2016-12-06 DIAGNOSIS — L0501 Pilonidal cyst with abscess: Secondary | ICD-10-CM | POA: Insufficient documentation

## 2016-12-06 MED ORDER — LIDOCAINE-EPINEPHRINE (PF) 2 %-1:200000 IJ SOLN
20.0000 mL | Freq: Once | INTRAMUSCULAR | Status: AC
  Administered 2016-12-06: 20 mL
  Filled 2016-12-06: qty 20

## 2016-12-06 MED ORDER — OXYCODONE-ACETAMINOPHEN 5-325 MG PO TABS
1.0000 | ORAL_TABLET | Freq: Once | ORAL | Status: AC
  Administered 2016-12-06: 1 via ORAL
  Filled 2016-12-06: qty 1

## 2016-12-06 MED ORDER — DOXYCYCLINE HYCLATE 100 MG PO CAPS: 100.0000 mg | ORAL_CAPSULE | Freq: Two times a day (BID) | ORAL | 0 refills | Status: DC

## 2016-12-06 NOTE — ED Provider Notes (Addendum)
[facility]-EMERGENCY DEPT Provider Note   CSN: [id number] Arrival date & time: 12/06/16  1344     History   Chief Complaint Chief Complaint  Patient presents with  . Tailbone Pain  . Abscess    HPI Seth Anderson is a 26 y.o. male presents to the ED for evaluation of boil to buttocks 2 days associated with swelling, redness and tenderness. Reports previous history of an abscesses area in the past required incision and drainage. He denies associated fevers, chills, body aches. No pain with BM, mucoid or bloody stools. States the boil started draining yesterday. He woke up today and the swelling and pain were worse. Pain is worse with palpation and sitting down and putting pressure on his buttocks. Has tried warm rag which has not helped. HPI  Past Medical History:  Diagnosis Date  . Boil of buttock     Patient Active Problem List   Diagnosis Date Noted  . Back pain, chronic 11/06/2012    Past Surgical History:  Procedure Laterality Date  . WISDOM TOOTH EXTRACTION         Home Medications    Prior to Admission medications   Medication Sig Start Date End Date Taking? Authorizing Provider  Witch Hazel LIQD Apply 1 application 2 (two) times daily topically.   Yes [provider]  doxycycline (VIBRAMYCIN) 100 MG capsule Take 1 capsule (100 mg total) 2 (two) times daily by mouth. 12/06/16   Liberty HandyGibbons, Zalika Tieszen J, PA-C    Family History Family History  Problem Relation Age of Onset  . Diabetes Mother   . Hypertension Mother     Social History Social History   Tobacco Use  . Smoking status: Current Every Day Smoker    Packs/day: 0.50  . Smokeless tobacco: Never Used  . Tobacco comment: states he will cut back  Substance Use Topics  . Alcohol use: No    Frequency: Never  . Drug use: No    Comment: 3x a month     Allergies   Patient has no known allergies.   Review of Systems Review of Systems  Skin:       + Boil  All  other systems reviewed and are negative.    Physical Exam Updated Vital Signs BP 131/80 (BP Location: Right Arm)   Pulse 71   Temp 99.2 F (37.3 C) (Oral)   Resp 20   Wt 112 kg (247 lb)   SpO2 98%   BMI 37.56 kg/m   Physical Exam  Constitutional: He is oriented to person, place, and time. He appears well-developed and well-nourished. No distress.  NAD.  HENT:  Head: Normocephalic and atraumatic.  Right Ear: External ear normal.  Left Ear: External ear normal.  Nose: Nose normal.  Eyes: Conjunctivae and EOM are normal. No scleral icterus.  Neck: Normal range of motion. Neck supple.  Cardiovascular: Normal rate, regular rhythm, normal heart sounds and intact distal pulses.  No murmur heard. Pulmonary/Chest: Effort normal and breath sounds normal. He has no wheezes.  Musculoskeletal: Normal range of motion. He exhibits no deformity.  Neurological: He is alert and oriented to person, place, and time.  Skin: Skin is warm and dry. Capillary refill takes less than 2 seconds.  8 x 10 cm area of induration, warmth, and tenderness to to suprasacral triangle; two focal area of fluctuance to pilonidal space with white center   Psychiatric: He has a normal mood and affect. His behavior is normal. Judgment and thought  content normal.  Nursing note and vitals reviewed.    ED Treatments / Results  Labs (all labs ordered are listed, but only abnormal results are displayed) Labs Reviewed - No data to display  EKG  EKG Interpretation None       Radiology No results found.  Procedures .Marland Kitchen.Incision and Drainage Date/Time: 12/25/2016 1:09 AM Performed by: Liberty HandyGibbons, Raushanah Osmundson J, PA-C Authorized by: Liberty HandyGibbons, Dareon Nunziato J, PA-C   Consent:    Consent obtained:  Verbal   Consent given by:  Patient   Risks discussed:  Bleeding, incomplete drainage, infection, pain and damage to other organs   Alternatives discussed:  Referral Location:    Type:  Abscess   Location:  Anogenital    Anogenital location:  Pilonidal Pre-procedure details:    Skin preparation:  Antiseptic wash and Betadine Anesthesia (see MAR for exact dosages):    Anesthesia method:  Local infiltration   Local anesthetic:  Lidocaine 2% WITH epi Procedure type:    Complexity:  Complex Procedure details:    Needle aspiration: no     Incision types:  Single straight   Incision depth:  Subcutaneous   Scalpel blade:  11   Wound management:  Probed and deloculated, irrigated with saline and extensive cleaning   Drainage:  Purulent   Drainage amount:  Copious   Packing materials:  1/2 in gauze Post-procedure details:    Patient tolerance of procedure:  Tolerated well, no immediate complications   (including critical care time)  Medications Ordered in ED Medications  oxyCODONE-acetaminophen (PERCOCET/ROXICET) 5-325 MG per tablet 1 tablet (1 tablet Oral Given 12/06/16 1548)  lidocaine-EPINEPHrine (XYLOCAINE W/EPI) 2 %-1:200000 (PF) injection 20 mL (20 mLs Infiltration Given by Other 12/06/16 1549)     Initial Impression / Assessment and Plan / ED Course  I have reviewed the triage vital signs and the nursing notes.  Pertinent labs & imaging results that were available during my care of the patient were reviewed by me and considered in my medical decision making (see chart for details).    26 yo male with h/o pilonidal abscess presents with pilonidal abscess today.Onset 2 days ago. No fevers, chills or other systemic symptoms. On exam he has obvious fluctuance and surrounding induration. He denies pain with BMs, mucoid, pus blood in stool. He is afebrile today. Incision and drainage done in ED with copious amount of purulent discharge. There was good improvement to edema, fluctuance and induration after procedure. Will d/c with antibiotics given mild surrounding cellulitis remaining, sitz baths, and f/u for wound check and packing removal in 2-3 days, sooner if worsening symptoms of infection. He is aware  of s/s that would warrant return to ED for re-eval.   Final Clinical Impressions(s) / ED Diagnoses   Final diagnoses:  Pilonidal abscess    ED Discharge Orders        Ordered    doxycycline (VIBRAMYCIN) 100 MG capsule  2 times daily     12/06/16 1754       Jerrell MylarGibbons, Jahmil Macleod J, PA-C 12/06/16 1756    Nira Connardama, Pedro Eduardo, MD 12/11/16 2358    Liberty HandyGibbons, Jeremi Losito J, PA-C 12/25/16 0110    Nira Connardama, Pedro Eduardo, MD 12/26/16 [phone]

## 2016-12-06 NOTE — ED Triage Notes (Signed)
Pt reports swelling and pain in coccyx area. Tender , raised fluid filled abscess noted. 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

## 2016-12-06 NOTE — Discharge Instructions (Signed)
Your abscess was drained today. There is still some swelling around the area so please take antibiotic  as prescribed. Do warm compresses or use heating pad to the area as often as possible. Take 1000 mg tylenol + 600 mg ibuprofen for pain every 8 hours. Return for wound check and packing removal in 2-3 days, earlier if you develop worsening signs of infection including increased redness, swelling, pain, fevers, chills, pain with bowel movement

## 2018-04-01 ENCOUNTER — Other Ambulatory Visit: Payer: Self-pay

## 2018-04-01 ENCOUNTER — Emergency Department (HOSPITAL_COMMUNITY)
Admission: EM | Admit: 2018-04-01 | Discharge: 2018-04-01 | Disposition: A | Discharge status: Home / Self Care | Payer: Self-pay | Attending: Emergency Medicine | Admitting: Emergency Medicine

## 2018-04-01 ENCOUNTER — Emergency Department (HOSPITAL_COMMUNITY): Payer: Self-pay

## 2018-04-01 ENCOUNTER — Encounter (HOSPITAL_COMMUNITY): Payer: Self-pay | Admitting: *Deleted

## 2018-04-01 DIAGNOSIS — R0789 Other chest pain: Secondary | ICD-10-CM | POA: Insufficient documentation

## 2018-04-01 DIAGNOSIS — M549 Dorsalgia, unspecified: Secondary | ICD-10-CM | POA: Insufficient documentation

## 2018-04-01 DIAGNOSIS — F17210 Nicotine dependence, cigarettes, uncomplicated: Secondary | ICD-10-CM | POA: Insufficient documentation

## 2018-04-01 DIAGNOSIS — R05 Cough: Secondary | ICD-10-CM | POA: Insufficient documentation

## 2018-04-01 LAB — CBC
HEMATOCRIT: 43.4 % (ref 39.0–52.0)
HEMOGLOBIN: 13.9 g/dL (ref 13.0–17.0)
MCH: 27 pg (ref 26.0–34.0)
MCHC: 32 g/dL (ref 30.0–36.0)
MCV: 84.4 fL (ref 80.0–100.0)
Platelets: 288 10*3/uL (ref 150–400)
RBC: 5.14 MIL/uL (ref 4.22–5.81)
RDW: 14.1 % (ref 11.5–15.5)
WBC: 11.5 10*3/uL — ABNORMAL HIGH (ref 4.0–10.5)
nRBC: 0 % (ref 0.0–0.2)

## 2018-04-01 LAB — BASIC METABOLIC PANEL
Anion gap: 8 (ref 5–15)
BUN: 9 mg/dL (ref 6–20)
CHLORIDE: 105 mmol/L (ref 98–111)
CO2: 25 mmol/L (ref 22–32)
Calcium: 9.1 mg/dL (ref 8.9–10.3)
Creatinine, Ser: 0.95 mg/dL (ref 0.61–1.24)
GFR calc Af Amer: >60 mL/min (ref >60)
GFR calc non Af Amer: >60 mL/min (ref >60)
GLUCOSE: 105 mg/dL — AB (ref 70–99)
Potassium: 3.6 mmol/L (ref 3.5–5.1)
SODIUM: 138 mmol/L (ref 135–145)

## 2018-04-01 LAB — I-STAT TROPONIN, ED: Troponin i, poc: 0 ng/mL (ref 0.00–0.08)

## 2018-04-01 MED ORDER — SODIUM CHLORIDE 0.9% FLUSH: 3.0000 mL | Freq: Once | INTRAVENOUS | Status: DC

## 2018-04-01 MED ORDER — METHOCARBAMOL 500 MG PO TABS: 500.0000 mg | ORAL_TABLET | Freq: Three times a day (TID) | ORAL | 0 refills | Status: DC

## 2018-04-01 MED ORDER — NAPROXEN 375 MG PO TABS: 375.0000 mg | ORAL_TABLET | Freq: Two times a day (BID) | ORAL | 0 refills | Status: DC

## 2018-04-01 NOTE — ED Provider Notes (Signed)
[facility] EMERGENCY DEPARTMENT Provider Note   CSN: [id number] Arrival date & time: 04/01/18  1431    History   Chief Complaint No chief complaint on file.   HPI Seth Anderson is a 28 y.o. male presented today for chest and back pain.  Patient reports that he works in [employer] and that yesterday shortly after he began working around 7 PM he developed chest and back pain.  He describes a aching type pain in the center of his chest that has been waxing and waning since 7 PM, moderate in intensity worsened with movement of the arms, palpation the chest.  Patient states that he also developed back pain last night as well that feels the same as his chest pain and is also worsened with movement of the arms and palpation of the back.  He has not taken any medication prior to arrival for his pain.  Patient reports that he is an otherwise healthy 28 year old male with no chronic medical conditions or daily medication use.  He denies fall or injury.  He attributes his pain today from working.  He denies recent fever, illness, diaphoresis, shortness of breath, abdominal pain, nausea/vomiting or diarrhea.  No loose stools was mentioned on triage note however he denies this on my history.  Patient does mention that he has had a mild nonproductive cough for greater than 1 week.  He denies fever or difficulty breathing.     HPI  Past Medical History:  Diagnosis Date  . Boil of buttock     Patient Active Problem List   Diagnosis Date Noted  . Back pain, chronic 11/06/2012    Past Surgical History:  Procedure Laterality Date  . WISDOM TOOTH EXTRACTION          Home Medications    Prior to Admission medications   Medication Sig Start Date End Date Taking? Authorizing Provider  doxycycline (VIBRAMYCIN) 100 MG capsule Take 1 capsule (100 mg total) 2 (two) times daily by mouth. 12/06/16   Liberty Handy, PA-C  methocarbamol (ROBAXIN) 500 MG tablet Take 1  tablet (500 mg total) by mouth 3 (three) times daily. 04/01/18   Harlene Salts A, PA-C  naproxen (NAPROSYN) 375 MG tablet Take 1 tablet (375 mg total) by mouth 2 (two) times daily. 04/01/18   Bill Salinas, PA-C  Witch Hazel LIQD Apply 1 application 2 (two) times daily topically.    [provider]    Family History Family History  Problem Relation Age of Onset  . Diabetes Mother   . Hypertension Mother     Social History Social History   Tobacco Use  . Smoking status: Current Every Day Smoker    Packs/day: 0.50  . Smokeless tobacco: Never Used  . Tobacco comment: states he will cut back  Substance Use Topics  . Alcohol use: No    Frequency: Never  . Drug use: No    Comment: 3x a month     Allergies   Patient has no known allergies.   Review of Systems Review of Systems  Constitutional: Negative for chills and fever.  Eyes: Negative.  Negative for visual disturbance.  Respiratory: Positive for cough (Mild intermittent nonproductive x1 week). Negative for shortness of breath.   Cardiovascular: Positive for chest pain (Worsened with movement of the arms and palpation). Negative for palpitations and leg swelling.  Gastrointestinal: Negative for abdominal pain, diarrhea, nausea and vomiting.  Musculoskeletal: Positive for back pain (Worsened with movement of  the arms and palpation). Negative for neck pain and neck stiffness.  Neurological: Negative.  Negative for syncope, weakness, numbness and headaches.  All other systems reviewed and are negative.  Physical Exam Updated Vital Signs BP 120/78   Pulse 82   Temp 99.3 F (37.4 C) (Oral)   Resp 18   Ht 5\' 9"  (1.753 m)   Wt 106.6 kg   SpO2 99%   BMI 34.70 kg/m   Physical Exam Constitutional:      General: He is not in acute distress.    Appearance: Normal appearance. He is well-developed. He is obese. He is not ill-appearing or diaphoretic.  HENT:     Head: Normocephalic and atraumatic.     Jaw:  There is normal jaw occlusion.     Right Ear: External ear normal.     Left Ear: External ear normal.     Nose: Nose normal.     Mouth/Throat:     Mouth: Mucous membranes are moist.     Pharynx: Oropharynx is clear. Uvula midline.  Eyes:     General: Vision grossly intact. Gaze aligned appropriately.     Extraocular Movements: Extraocular movements intact.     Conjunctiva/sclera: Conjunctivae normal.     Pupils: Pupils are equal, round, and reactive to light.  Neck:     Musculoskeletal: Full passive range of motion without pain, normal range of motion and neck supple. No spinous process tenderness or muscular tenderness.     Trachea: Trachea and phonation normal. No tracheal tenderness or tracheal deviation.  Cardiovascular:     Rate and Rhythm: Normal rate and regular rhythm.     Pulses:          Radial pulses are 2+ on the right side and 2+ on the left side.       Dorsalis pedis pulses are 2+ on the right side and 2+ on the left side.       Posterior tibial pulses are 2+ on the right side and 2+ on the left side.     Heart sounds: Normal heart sounds.  Pulmonary:     Effort: Pulmonary effort is normal. No accessory muscle usage or respiratory distress.     Breath sounds: Normal breath sounds and air entry. No wheezing or rhonchi.  Chest:     Chest wall: Tenderness present. No deformity or crepitus.       Comments: Consistently reproducible pain to palpation of the sternum and musculature of the chest wall.  Pain is also reproduced with movement of the arms. Abdominal:     General: Bowel sounds are normal. There is no distension.     Palpations: Abdomen is soft.     Tenderness: There is no abdominal tenderness. There is no guarding or rebound. Negative signs include Murphy's sign, Rovsing's sign and McBurney's sign.  Musculoskeletal: Normal range of motion.       Back:     Right lower leg: Normal. He exhibits no tenderness. No edema.     Left lower leg: Normal. He exhibits no  tenderness. No edema.     Comments: No midline C/T/L spinal tenderness to palpation, no deformity, crepitus, or step-off noted. No sign of injury to the neck or back. - Patient with consistently reproducible tenderness of the periscapular muscles bilaterally.  Pain also with movement of the arms bilaterally. - Ambulatory without difficulty or assistance.  Feet:     Right foot:     Protective Sensation: 5 sites tested. 5 sites sensed.  Left foot:     Protective Sensation: 5 sites tested. 5 sites sensed.  Skin:    General: Skin is warm and dry.     Capillary Refill: Capillary refill takes less than 2 seconds.     Comments: Equal color to all 4 extremities with appropriate temperature.  Neurological:     Mental Status: He is alert.     GCS: GCS eye subscore is 4. GCS verbal subscore is 5. GCS motor subscore is 6.     Comments: Speech is clear and goal oriented, follows commands Major Cranial nerves without deficit, no facial droop Moves extremities without ataxia, coordination intact Normal gait  Psychiatric:        Behavior: Behavior normal.    ED Treatments / Results  Labs (all labs ordered are listed, but only abnormal results are displayed) Labs Reviewed  BASIC METABOLIC PANEL - Abnormal; Notable for the following components:      Result Value   Glucose, Bld 105 (*)    All other components within normal limits  CBC - Abnormal; Notable for the following components:   WBC 11.5 (*)    All other components within normal limits  I-STAT TROPONIN, ED    EKG EKG Interpretation  Date/Time:  Tuesday April 01 2018 14:42:20 EDT Ventricular Rate:  83 PR Interval:  142 QRS Duration: 72 QT Interval:  358 QTC Calculation: 420 R Axis:   44 Text Interpretation:  Normal sinus rhythm Normal ECG isolated flipped t wave in lead III seen on prior ecgs Otherwise no significant change Confirmed by Melene Plan [phone]) on 04/01/2018 2:53:39 PM   Radiology Dg Chest 2 View  Result Date:  04/01/2018 CLINICAL DATA:  Chest pain and cough EXAM: CHEST - 2 VIEW COMPARISON:  None. FINDINGS: Lungs are clear. Heart size and pulmonary vascularity are normal. No adenopathy. No pneumothorax. No bone lesions. IMPRESSION: No edema or consolidation. Electronically Signed   By: Bretta Bang III M.D.   On: 04/01/2018 15:20    Procedures Procedures (including critical care time)  Medications Ordered in ED Medications  sodium chloride flush (NS) 0.9 % injection 3 mL (has no administration in time range)     Initial Impression / Assessment and Plan / ED Course  I have reviewed the triage vital signs and the nursing notes.  Pertinent labs & imaging results that were available during my care of the patient were reviewed by me and considered in my medical decision making (see chart for details).    Troponin negative CBC unremarkable BMP unremarkable Chest x-ray: FINDINGS: Lungs are clear. Heart size and pulmonary vascularity are normal. No adenopathy. No pneumothorax. No bone lesions. IMPRESSION: No edema or consolidation.  EKG reviewed with Dr. Adela Lank without acute findings - Suspect musculoskeletal etiology of patient's pain today.  Do not suspect acute cardiopulmonary etiology due to presentation and exam, it is consistently reproducible with palpation of the musculature and movement of the arms.  Patient without any significant risk factors and pain is been present greater than 18 hours.  Doubt ACS, PE or dissection.  Pulses intact and equal distally.  Patient is low risk by Wells criteria, PERC negative.  Vital signs stable.  Troponin negative.  Chest x-ray negative.  Patient is without abdominal pain, nausea or vomiting, doubt pancreatitis or gallbladder etiology.  Patient without cauda equina-like symptoms or risk factors for epidural abscess, doubt AAA.  Patient is to be discharged with admission for PCP follow-up.  Patient has been prescribed Robaxin and naproxen  and given  precautions regarding each.  Patient states understanding of care plan and is agreeable for discharge.  - Patient is ambulating around the hallways in emergency department and requesting immediate discharge, he is agreeable to staying for completion of his paperwork. - At this time there does not appear to be any evidence of an acute emergency medical condition and the patient appears stable for discharge with appropriate outpatient follow up. Diagnosis was discussed with patient who verbalizes understanding of care plan and is agreeable to discharge. I have discussed return precautions with patient and mother who verbalize understanding of return precautions. Patient encouraged to follow-up with their PCP. All questions answered.  Patient's case discussed with Dr. Adela Lank who agrees with plan to discharge at this time.  Note: Portions of this report may have been transcribed using voice recognition software. Every effort was made to ensure accuracy; however, inadvertent computerized transcription errors may still be present. Final Clinical Impressions(s) / ED Diagnoses   Final diagnoses:  Musculoskeletal chest pain    ED Discharge Orders         Ordered    methocarbamol (ROBAXIN) 500 MG tablet  3 times daily     04/01/18 1654    naproxen (NAPROSYN) 375 MG tablet  2 times daily     04/01/18 1654           Elizabeth Palau 04/01/18 1742    Melene Plan, DO 04/01/18 1856

## 2018-04-01 NOTE — Discharge Instructions (Signed)
You have been diagnosed today with musculoskeletal chest pain.  At this time there does not appear to be the presence of an emergent medical condition, however there is always the potential for conditions to change. Please read and follow the below instructions.  Please return to the Emergency Department immediately for any new or worsening symptoms. Please be sure to follow up with your Primary Care Provider within one week regarding your visit today; please call their office to schedule an appointment even if you are feeling better for a follow-up visit.  If you do not have a primary care provider you may contact [facility] to establish one. You may use the muscle relaxer Robaxin as prescribed to help with your symptoms.  Do not drive or operate machinery while taking this medication as it may make you sleepy. You may use the medication naproxen as prescribed to help with your symptoms.  Please take this medication with food and drink plenty of water.  Get help right away if: Your chest pain is worse. You have a cough that gets worse, or you cough up blood. You have very bad (severe) pain in your belly (abdomen). You pass out (faint). You have either of these for no clear reason: Sudden chest discomfort. Sudden discomfort in your arms, back, neck, or jaw. You have shortness of breath at any time. You suddenly start to sweat, or your skin gets clammy. You feel sick to your stomach (nauseous). You throw up (vomit). You suddenly feel lightheaded or dizzy. You feel very weak or tired. Your heart starts to beat fast, or it feels like it is skipping beats. Get help right away if: You develop new bowel or bladder control problems. You have unusual weakness or numbness in your arms or legs. You develop nausea or vomiting. You develop abdominal pain. You feel faint. Any new or concerning symptoms  Please read the additional information packets attached to your  discharge summary.  Do not take your medicine if  develop an itchy rash, swelling in your mouth or lips, or difficulty breathing.

## 2018-04-01 NOTE — ED Triage Notes (Signed)
Pt in c/o CP & SOB onset last night, denies n/v, pt reports pain radiates to mid upper back, pt reports x 3 episodes of loose stool in the last 24 hrs, pt c/o dizziness, denies cardiac history, A&Ox4

## 2018-06-22 ENCOUNTER — Other Ambulatory Visit: Payer: Self-pay

## 2018-06-22 ENCOUNTER — Encounter (HOSPITAL_COMMUNITY): Payer: Self-pay

## 2018-06-22 ENCOUNTER — Ambulatory Visit (HOSPITAL_COMMUNITY)
Admission: EM | Admit: 2018-06-22 | Discharge: 2018-06-22 | Disposition: A | Discharge status: Home / Self Care | Payer: Self-pay | Attending: Family Medicine | Admitting: Family Medicine

## 2018-06-22 DIAGNOSIS — L0501 Pilonidal cyst with abscess: Secondary | ICD-10-CM

## 2018-06-22 MED ORDER — DOXYCYCLINE HYCLATE 100 MG PO CAPS: 100.0000 mg | ORAL_CAPSULE | Freq: Two times a day (BID) | ORAL | 0 refills | Status: DC

## 2018-06-22 MED ORDER — LIDOCAINE-EPINEPHRINE (PF) 2 %-1:200000 IJ SOLN
INTRAMUSCULAR | Status: AC
  Filled 2018-06-22: qty 20

## 2018-06-22 NOTE — ED Triage Notes (Signed)
Pt presents with abscess on left buttocks for past 2 days that is not draining yet.

## 2018-06-22 NOTE — Discharge Instructions (Signed)
Complete course of antibiotics.  Warm soaks/compresses to promote further drainage.  Keep covered to collect drainage.  Remove the packing in 3 days.  Please follow up with general surgery for definitive treatment of this to prevent recurrence.  Ibuprofen or aleve as needed for pain.

## 2018-06-22 NOTE — ED Provider Notes (Signed)
[facility]    CSN: [id number] Arrival date & time: 06/22/18  1311     History   Chief Complaint Chief Complaint  Patient presents with  . Abscess    HPI Seth Anderson is a 28 y.o. male.   Seth Anderson presents with complaints of abscess to buttocks which he noted approximately 3 days ago. He has had before, required I&D before. No active drainage. No fevers. Hasn't followed with general surgery for this. No known MRSA. No nausea or vomiting. Causes back pain. Hasn't taken any medications for pain.    ROS per HPI, negative if not otherwise mentioned.      Past Medical History:  Diagnosis Date  . Boil of buttock     Patient Active Problem List   Diagnosis Date Noted  . Back pain, chronic 11/06/2012    Past Surgical History:  Procedure Laterality Date  . WISDOM TOOTH EXTRACTION         Home Medications    Prior to Admission medications   Medication Sig Start Date End Date Taking? Authorizing Provider  doxycycline (VIBRAMYCIN) 100 MG capsule Take 1 capsule (100 mg total) by mouth 2 (two) times daily. 06/22/18   Georgetta Haber, NP  methocarbamol (ROBAXIN) 500 MG tablet Take 1 tablet (500 mg total) by mouth 3 (three) times daily. 04/01/18   Harlene Salts A, PA-C  naproxen (NAPROSYN) 375 MG tablet Take 1 tablet (375 mg total) by mouth 2 (two) times daily. 04/01/18   Bill Salinas, PA-C  Witch Hazel LIQD Apply 1 application 2 (two) times daily topically.    [provider]    Family History Family History  Problem Relation Age of Onset  . Diabetes Mother   . Hypertension Mother     Social History Social History   Tobacco Use  . Smoking status: Current Every Day Smoker    Packs/day: 0.50  . Smokeless tobacco: Never Used  . Tobacco comment: states he will cut back  Substance Use Topics  . Alcohol use: No    Frequency: Never  . Drug use: No    Comment: 3x a month     Allergies   Patient has no known allergies.   Review of Systems Review of Systems   Physical Exam Triage Vital Signs ED Triage Vitals  Enc Vitals Group     BP 06/22/18 1326 120/74     Pulse Rate 06/22/18 1326 93     Resp 06/22/18 1326 18     Temp 06/22/18 1326 98.7 F (37.1 C)     Temp Source 06/22/18 1326 Oral     SpO2 06/22/18 1326 93 %     Weight --      Height --      Head Circumference --      Peak Flow --      Pain Score 06/22/18 1328 8     Pain Loc --      Pain Edu? --      Excl. in GC? --    No data found.  Updated Vital Signs BP 120/74 (BP Location: Left Arm)   Pulse 93   Temp 98.7 F (37.1 C) (Oral)   Resp 18   SpO2 93%    Physical Exam Constitutional:      Appearance: He is well-developed.  Cardiovascular:     Rate and Rhythm: Normal rate and regular rhythm.  Pulmonary:     Effort: Pulmonary effort is normal.  Breath sounds: Normal breath sounds.  Skin:    General: Skin is warm and dry.          Comments: Pilonidal abscess palpable visible with surroudning redness to low back/ gluteal cleft   Neurological:     Mental Status: He is alert and oriented to person, place, and time.      UC Treatments / Results  Labs (all labs ordered are listed, but only abnormal results are displayed) Labs Reviewed - No data to display  EKG None  Radiology No results found.  Procedures Incision and Drainage Date/Time: 06/22/2018 4:11 PM Performed by: Georgetta HaberBurky, Celeste Tavenner B, NP Authorized by: Elvina SidleLauenstein, Kurt, MD   Consent:    Consent obtained:  Verbal   Consent given by:  Patient   Risks discussed:  Incomplete drainage, pain, infection and bleeding   Alternatives discussed:  No treatment, observation, referral and alternative treatment Location:    Type:  Pilonidal cyst   Size:  3   Location:  Anogenital   Anogenital location:  Pilonidal Pre-procedure details:    Skin preparation:  Betadine Anesthesia (see MAR for exact dosages):    Anesthesia method:  Local infiltration   Local anesthetic:   Lidocaine 2% WITH epi Procedure type:    Complexity:  Complex Procedure details:    Needle aspiration: no     Incision types:  Single straight   Scalpel blade:  11   Wound management:  Probed and deloculated   Drainage:  Purulent   Drainage amount:  Copious   Packing materials:  1/2 in gauze Post-procedure details:    Patient tolerance of procedure:  Tolerated well, no immediate complications   (including critical care time)  Medications Ordered in UC Medications - No data to display  Initial Impression / Assessment and Plan / UC Course  I have reviewed the triage vital signs and the nursing notes.  Pertinent labs & imaging results that were available during my care of the patient were reviewed by me and considered in my medical decision making (see chart for details).     Copious output from pilonidal abscess. Patient declines hydrocodone in clinic today. Encouraged warm soaks, ibuprofen prn, antibiotics provided. Encouraged to follow up with general surgery for complete treatment. Return precautions provided. To take out packing in 3 days/ Patient verbalized understanding and agreeable to plan.   Final Clinical Impressions(s) / UC Diagnoses   Final diagnoses:  Pilonidal abscess     Discharge Instructions     Complete course of antibiotics.  Warm soaks/compresses to promote further drainage.  Keep covered to collect drainage.  Remove the packing in 3 days.  Please follow up with general surgery for definitive treatment of this to prevent recurrence.  Ibuprofen or aleve as needed for pain.    ED Prescriptions    Medication Sig Dispense Auth. Provider   doxycycline (VIBRAMYCIN) 100 MG capsule Take 1 capsule (100 mg total) by mouth 2 (two) times daily. 20 capsule Georgetta HaberBurky, Kacee Koren B, NP     Controlled Substance Prescriptions [place] Controlled Substance Registry consulted? Not Applicable   Georgetta HaberBurky, Ivee Poellnitz B, NP 06/22/18 [phone]

## 2021-03-28 ENCOUNTER — Emergency Department (HOSPITAL_COMMUNITY)
Admission: EM | Admit: 2021-03-28 | Discharge: 2021-03-28 | Disposition: A | Discharge status: Home / Self Care | Payer: Worker's Compensation | Attending: Emergency Medicine | Admitting: Emergency Medicine

## 2021-03-28 ENCOUNTER — Encounter (HOSPITAL_COMMUNITY): Payer: Self-pay | Admitting: Emergency Medicine

## 2021-03-28 ENCOUNTER — Emergency Department (HOSPITAL_COMMUNITY): Payer: Worker's Compensation

## 2021-03-28 DIAGNOSIS — W208XXA Other cause of strike by thrown, projected or falling object, initial encounter: Secondary | ICD-10-CM | POA: Insufficient documentation

## 2021-03-28 DIAGNOSIS — S39012A Strain of muscle, fascia and tendon of lower back, initial encounter: Secondary | ICD-10-CM | POA: Diagnosis not present

## 2021-03-28 DIAGNOSIS — S161XXA Strain of muscle, fascia and tendon at neck level, initial encounter: Secondary | ICD-10-CM | POA: Diagnosis not present

## 2021-03-28 DIAGNOSIS — S060X0A Concussion without loss of consciousness, initial encounter: Secondary | ICD-10-CM | POA: Insufficient documentation

## 2021-03-28 DIAGNOSIS — Y990 Civilian activity done for income or pay: Secondary | ICD-10-CM | POA: Diagnosis not present

## 2021-03-28 DIAGNOSIS — R519 Headache, unspecified: Secondary | ICD-10-CM | POA: Diagnosis present

## 2021-03-28 MED ORDER — CYCLOBENZAPRINE HCL 10 MG PO TABS: 10.0000 mg | ORAL_TABLET | Freq: Two times a day (BID) | ORAL | 0 refills | Status: DC | PRN

## 2021-03-28 MED ORDER — IBUPROFEN 600 MG PO TABS: 600.0000 mg | ORAL_TABLET | Freq: Four times a day (QID) | ORAL | 0 refills | Status: DC | PRN

## 2021-03-28 NOTE — ED Provider Notes (Signed)
?[facility]-EMERGENCY DEPT ?Provider Note ? ? ?CSN: [id number] ?Arrival date & time: 03/28/21  0830 ? ?  ? ?History ? ?Chief Complaint  ?Patient presents with  ? Back Pain  ? Headache  ? ? ?Seth Anderson is a 31 y.o. male present emergency department headache and back pain.  Patient ports that he was at work in a warehouse 4 days ago and reports that a 30 kg box fell and struck him on the top of the head.  He felt stunned at the time, had a headache, and subsequently developed throbbing pain in his bilateral shoulder muscles, lower neck, and lower back, worse with any type of movement.  He reports some nausea and intermittent blurred vision.  He is not on blood thinners.  He denies any other significant medical problems. ? ?HPI ? ?  ? ?Home Medications ?Prior to Admission medications   ?Medication Sig Start Date End Date Taking? Authorizing Provider  ?cyclobenzaprine (FLEXERIL) 10 MG tablet Take 1 tablet (10 mg total) by mouth 2 (two) times daily as needed for up to 15 doses for muscle spasms. 03/28/21  Yes Kalub Morillo, Kermit Balo, MD  ?ibuprofen (ADVIL) 600 MG tablet Take 1 tablet (600 mg total) by mouth every 6 (six) hours as needed for up to 30 doses. 03/28/21  Yes Azari Janssens, Kermit Balo, MD  ?doxycycline (VIBRAMYCIN) 100 MG capsule Take 1 capsule (100 mg total) by mouth 2 (two) times daily. 06/22/18   Georgetta Haber, NP  ?methocarbamol (ROBAXIN) 500 MG tablet Take 1 tablet (500 mg total) by mouth 3 (three) times daily. 04/01/18   Harlene Salts A, PA-C  ?naproxen (NAPROSYN) 375 MG tablet Take 1 tablet (375 mg total) by mouth 2 (two) times daily. 04/01/18   Bill Salinas, PA-C  ?Witch Hazel LIQD Apply 1 application 2 (two) times daily topically.    [provider]  ?   ? ?Allergies    ?Patient has no known allergies.   ? ?Review of Systems   ?Review of Systems ? ?Physical Exam ?Updated Vital Signs ?BP (!) 155/96   Pulse 66   Temp 97.8 ?F (36.6 ?C) (Oral)   Resp 18   Ht 5\' 9"  (1.753 m)    Wt 107 kg   SpO2 100%   BMI 34.84 kg/m?  ?Physical Exam ?Constitutional:   ?   General: He is not in acute distress. ?HENT:  ?   Head: Normocephalic and atraumatic.  ?Eyes:  ?   Extraocular Movements: Extraocular movements intact.  ?   Conjunctiva/sclera: Conjunctivae normal.  ?   Pupils: Pupils are equal, round, and reactive to light.  ?Cardiovascular:  ?   Rate and Rhythm: Normal rate and regular rhythm.  ?Pulmonary:  ?   Effort: Pulmonary effort is normal. No respiratory distress.  ?Musculoskeletal:  ?   Cervical back: Normal range of motion and neck supple.  ?   Comments: Trigger point tenderness of the paracervical and paralumbar spinal region, without midline tenderness  ?Skin: ?   General: Skin is warm and dry.  ?Neurological:  ?   General: No focal deficit present.  ?   Mental Status: He is alert and oriented to person, place, and time. Mental status is at baseline.  ?   Cranial Nerves: No cranial nerve deficit.  ?Psychiatric:     ?   Mood and Affect: Mood normal.     ?   Behavior: Behavior normal.  ? ? ?ED Results / Procedures / Treatments   ?  Labs ?(all labs ordered are listed, but only abnormal results are displayed) ?Labs Reviewed - No data to display ? ?EKG ?None ? ?Radiology ?CT Head Wo Contrast ? ?Result Date: 03/28/2021 ?CLINICAL DATA:  Trauma EXAM: CT HEAD WITHOUT CONTRAST TECHNIQUE: Contiguous axial images were obtained from the base of the skull through the vertex without intravenous contrast. RADIATION DOSE REDUCTION: This exam was performed according to the departmental dose-optimization program which includes automated exposure control, adjustment of the mA and/or kV according to patient size and/or use of iterative reconstruction technique. COMPARISON:  None. FINDINGS: Brain: No acute intracranial findings are seen in noncontrast CT brain. There are no signs of bleeding. Ventricles are not dilated. There is no shift of midline structures. Vascular: Unremarkable. Skull: No fracture is seen.  There is subcutaneous contusion in the parietal scalp. Sinuses/Orbits: There is mild mucosal thickening in the ethmoid and maxillary sinuses. Other: None IMPRESSION: No acute intracranial findings are seen in noncontrast CT brain. Electronically Signed   By: Ernie Avena M.D.   On: 03/28/2021 09:37   ? ?Procedures ?Procedures  ? ? ?Medications Ordered in ED ?Medications - No data to display ? ?ED Course/ Medical Decision Making/ A&P ?  ?                        ?Medical Decision Making ?Amount and/or Complexity of Data Reviewed ?Radiology: ordered. ? ?Risk ?Prescription drug management. ? ? ?Patient is here with an isolated head injury at work.  Differential diagnosis would include concussion most likely based on constellation of symptoms with subsequent cervical and paraspinal muscle spasm.  I have a lower suspicion for spinal fracture.  I do think is reasonable to obtain a CT scan of the head to rule out intracranial hemorrhage or subdural bleed, the patient is in agreement.  I personally ordered, interpreted and reviewed the patient's CT imaging, which was notable for no acute traumatic injuries noted (no ICH). ? ?Concussion precautions were discussed, and a work note was provided for light duties for the next 2 weeks. ? ?I will prescribe NSAIDs and muscle relaxers at home.  Okay for discharge ? ? ? ? ? ? ? ?Final Clinical Impression(s) / ED Diagnoses ?Final diagnoses:  ?Strain of lumbar region, initial encounter  ?Concussion without loss of consciousness, initial encounter  ?Strain of neck muscle, initial encounter  ? ? ?Rx / DC Orders ?ED Discharge Orders   ? ?      Ordered  ?  cyclobenzaprine (FLEXERIL) 10 MG tablet  2 times daily PRN       ? 03/28/21 0949  ?  ibuprofen (ADVIL) 600 MG tablet  Every 6 hours PRN       ? 03/28/21 0949  ? ?  ?  ? ?  ? ? ?  ?Terald Sleeper, MD ?03/28/21 [phone] ? ?

## 2021-03-28 NOTE — ED Triage Notes (Signed)
Patient reports a box with a couch in it weighing 30kg fell on his head last Thursday. Denies LOC, emesis or open laceration after injury. Reports HA, nausea and now mid-back pain. Has not tried anything for pain.  ?

## 2021-03-28 NOTE — Discharge Instructions (Addendum)
You were prescribed Flexeril a muscle relaxer.  This medicine can make you drowsy.  Do not drive, operate machinery, swim, or perform dangerous activities after taking Flexeril. ?

## 2021-10-28 ENCOUNTER — Ambulatory Visit
Admission: EM | Admit: 2021-10-28 | Discharge: 2021-10-28 | Disposition: A | Discharge status: Home / Self Care | Payer: Managed Care, Other (non HMO) | Attending: Emergency Medicine | Admitting: Emergency Medicine

## 2021-10-28 DIAGNOSIS — L0591 Pilonidal cyst without abscess: Secondary | ICD-10-CM | POA: Diagnosis not present

## 2021-10-28 DIAGNOSIS — L0501 Pilonidal cyst with abscess: Secondary | ICD-10-CM | POA: Diagnosis not present

## 2021-10-28 MED ORDER — CLINDAMYCIN HCL 300 MG PO CAPS: 300.0000 mg | ORAL_CAPSULE | Freq: Four times a day (QID) | ORAL | 0 refills | Status: AC

## 2021-10-28 NOTE — ED Provider Notes (Signed)
[facility]    CSN: [id number] Arrival date & time: 10/28/21  9381    HISTORY   Chief Complaint  Patient presents with   Abscess   HPI Seth Anderson is a pleasant, 31 y.o. male who presents to urgent care today. Pt c/o recurrent abscess on the top left side of his bottom, states he this has been flaring up every 3-6 months for the past 10+ years, states he does not have a PCP, usually has them drained at urgent care facilities.  Patient states that his current abscess began 3 days ago and has rapidly become large, painful and has also drained some yellowish, thick fluid.  Patient states that when the abscess recurs it does not always get this big or painful, sometimes it goes away without intervention.  The history is provided by the patient.   Past Medical History:  Diagnosis Date   Boil of buttock    Patient Active Problem List   Diagnosis Date Noted   Back pain, chronic 11/06/2012   Past Surgical History:  Procedure Laterality Date   WISDOM TOOTH EXTRACTION      Home Medications    Prior to Admission medications   Medication Sig Start Date End Date Taking? Authorizing Provider  cyclobenzaprine (FLEXERIL) 10 MG tablet Take 1 tablet (10 mg total) by mouth 2 (two) times daily as needed for up to 15 doses for muscle spasms. 03/28/21   Terald Sleeper, MD  doxycycline (VIBRAMYCIN) 100 MG capsule Take 1 capsule (100 mg total) by mouth 2 (two) times daily. 06/22/18   Georgetta Haber, NP  ibuprofen (ADVIL) 600 MG tablet Take 1 tablet (600 mg total) by mouth every 6 (six) hours as needed for up to 30 doses. 03/28/21   Terald Sleeper, MD  methocarbamol (ROBAXIN) 500 MG tablet Take 1 tablet (500 mg total) by mouth 3 (three) times daily. 04/01/18   Harlene Salts A, PA-C  naproxen (NAPROSYN) 375 MG tablet Take 1 tablet (375 mg total) by mouth 2 (two) times daily. 04/01/18   Bill Salinas, PA-C  Witch Hazel LIQD Apply 1 application 2 (two) times daily topically.     [provider]    Family History Family History  Problem Relation Age of Onset   Diabetes Mother    Hypertension Mother    Social History Social History   Tobacco Use   Smoking status: Every Day    Packs/day: 0.50    Types: Cigarettes   Smokeless tobacco: Never   Tobacco comments:    states he will cut back  Substance Use Topics   Alcohol use: No   Drug use: No    Comment: 3x a month   Allergies   Patient has no known allergies.  Review of Systems Review of Systems Pertinent findings revealed after performing a 14 point review of systems has been noted in the history of present illness.  Physical Exam Triage Vital Signs ED Triage Vitals  Enc Vitals Group     BP 11/18/20 0827 (!) 147/82     Pulse Rate 11/18/20 0827 72     Resp 11/18/20 0827 18     Temp 11/18/20 0827 98.3 F (36.8 C)     Temp Source 11/18/20 0827 Oral     SpO2 11/18/20 0827 98 %     Weight --      Height --      Head Circumference --      Peak Flow --  Pain Score 11/18/20 0826 5     Pain Loc --      Pain Edu? --      Excl. in [place]? --   No data found.  Updated Vital Signs BP 136/69 (BP Location: Right Arm)   Pulse 67   Temp 98.6 F (37 C) (Oral)   Resp 16   SpO2 95%   Physical Exam Vitals and nursing note reviewed.  Constitutional:      General: He is not in acute distress.    Appearance: Normal appearance. He is normal weight. He is not ill-appearing.  HENT:     Head: Normocephalic and atraumatic.  Eyes:     Extraocular Movements: Extraocular movements intact.     Conjunctiva/sclera: Conjunctivae normal.     Pupils: Pupils are equal, round, and reactive to light.  Cardiovascular:     Rate and Rhythm: Normal rate and regular rhythm.  Pulmonary:     Effort: Pulmonary effort is normal.     Breath sounds: Normal breath sounds.  Musculoskeletal:        General: Normal range of motion.     Cervical back: Normal range of motion and neck supple.  Skin:    General:  Skin is warm and dry.     Findings: Lesion (4 x 6 cm pilonidal cyst with surrounding induration and erythema, hemorrhage in the middle with some scant, dried drainage at center of lesion) present.  Neurological:     General: No focal deficit present.     Mental Status: He is alert and oriented to person, place, and time. Mental status is at baseline.  Psychiatric:        Mood and Affect: Mood normal.        Behavior: Behavior normal.        Thought Content: Thought content normal.        Judgment: Judgment normal.     Visual Acuity Right Eye Distance:   Left Eye Distance:   Bilateral Distance:    Right Eye Near:   Left Eye Near:    Bilateral Near:     [facility] Couse / Diagnostics / Procedures:     Radiology No results found.  Procedures Incision and Drainage  Date/Time: 10/28/2021 11:23 AM  Performed by: Lynden Oxford Scales, PA-C Authorized by: Lynden Oxford Scales, PA-C   Consent:    Consent obtained:  Verbal   Consent given by:  Patient   Risks discussed:  Bleeding, incomplete drainage, pain and damage to other organs   Alternatives discussed:  No treatment Universal protocol:    Procedure explained and questions answered to patient or proxy's satisfaction: yes     Relevant documents present and verified: yes     Test results available : yes     Imaging studies available: yes     Required blood products, implants, devices, and special equipment available: yes     Site/side marked: yes     Immediately prior to procedure, a time out was called: yes     Patient identity confirmed:  Verbally with patient and arm band Location:    Type:  Pilonidal cyst   Size:  4cm x 6 cm   Location:  Anogenital   Anogenital location: left side of superior aspect of gluteal fold. Pre-procedure details:    Skin preparation:  Betadine Sedation:    Sedation type:  None Anesthesia:    Anesthesia method:  Local infiltration   Local anesthetic:  Lidocaine 1% WITH epi and lidocaine 2%  WITH epi Procedure type:    Complexity:  Complex Procedure details:    Ultrasound guidance: no     Needle aspiration: no     Incision types:  Single straight   Incision depth:  Subcutaneous   Wound management:  Probed and deloculated, irrigated with saline and extensive cleaning   Drainage:  Bloody and serosanguinous   Drainage amount:  Copious   Wound treatment:  Drain placed   Packing materials:  1/2 in iodoform gauze   Amount 1/2" iodoform:  24 inches Post-procedure details:    Procedure completion:  Tolerated with difficulty  (including critical care time) EKG  Pending results:  Labs Reviewed - No data to display  Medications Ordered in UC: Medications - No data to display  UC Diagnoses / Final Clinical Impressions(s)   I have reviewed the triage vital signs and the nursing notes.  Pertinent labs & imaging results that were available during my care of the patient were reviewed by me and considered in my medical decision making (see chart for details).    Final diagnoses:  Pilonidal cyst of natal cleft  Cyst, pilonidal, with abscess  Chronic recurrent pilonidal cyst   Patient advised to perform daily dressing changes and to advance iodoform gauze by 1 inch daily.  Patient advised return in 10 days to have single suture removed.  Antibiotics prescribed.  Patient strongly encouraged to follow-up with primary care to discuss referral to general surgery to have cyst permanently removed.  Return precautions advised.  ED Prescriptions     Medication Sig Dispense Auth. Provider   clindamycin (CLEOCIN) 300 MG capsule Take 1 capsule (300 mg total) by mouth 4 (four) times daily for 10 days. 40 capsule Theadora Rama Scales, PA-C      PDMP not reviewed this encounter.  Pending results:  Labs Reviewed - No data to display  Discharge Instructions:   Discharge Instructions      Requesting information about pilonidal cysts, help to drain them and how they are removed that  had been unhelpful.  Please follow-up with your new primary care provider about your cyst and other management.  Due to the duration of this issue for you as well as the size of your lesion today, I strongly encourage you to consider surgical management of the cyst.  Recovery can be long and uncomfortable but it is well worth doing.  This cyst will begin to adversely affect your quality of life if it has not already.  It would be nice to get back to you with this anymore.  As we discussed, there is packing to prevent your wound to encourage the cyst to continue to completely drain.  Please change your dressing every day.  During dressing changes, pull out 1 inch of the packing out of the wound and trim off the excess apply bandaging.  I sent prescription for Clindamycin to your pharmacy that I would like you to take for the next 10 days.  Please take 1 capsule 4 times daily.  Please return in 7 days to have the site reevaluated and to remove the stitch I placed to keep the wound closed.    Please return sooner if you feel the wound is not improving or is becoming more painful.    Thank you for visiting [place] here at urgent care today.  I wish you all the best in your journey.        Disposition Upon Discharge:  Condition: stable for discharge home  Patient presented  with an acute illness with associated systemic symptoms and significant discomfort requiring urgent management. In my opinion, this is a condition that a prudent lay person (someone who possesses an average knowledge of health and medicine) may potentially expect to result in complications if not addressed urgently such as respiratory distress, impairment of bodily function or dysfunction of bodily organs.   Routine symptom specific, illness specific and/or disease specific instructions were discussed with the patient and/or caregiver at length.   As such, the patient has been evaluated and assessed, work-up was performed and  treatment was provided in alignment with urgent care protocols and evidence based medicine.  Patient/parent/caregiver has been advised that the patient may require follow up for further testing and treatment if the symptoms continue in spite of treatment, as clinically indicated and appropriate.  Patient/parent/caregiver has been advised to return to the [facility] or PCP if no better; to PCP or the Emergency Department if new signs and symptoms develop, or if the current signs or symptoms continue to change or worsen for further workup, evaluation and treatment as clinically indicated and appropriate  The patient will follow up with their current PCP if and as advised. If the patient does not currently have a PCP we will assist them in obtaining one.   The patient may need specialty follow up if the symptoms continue, in spite of conservative treatment and management, for further workup, evaluation, consultation and treatment as clinically indicated and appropriate.   Patient/parent/caregiver verbalized understanding and agreement of plan as discussed.  All questions were addressed during visit.  Please see discharge instructions below for further details of plan.  This office note has been dictated using Teaching laboratory technician.  Unfortunately, this method of dictation can sometimes lead to typographical or grammatical errors.  I apologize for your inconvenience in advance if this occurs.  Please do not hesitate to reach out to me if clarification is needed.      Theadora Rama Scales, [place] 10/29/21 [phone]

## 2021-10-28 NOTE — ED Triage Notes (Signed)
The patient c/o abscess to lower back that started a few days ago per the patient. The patient states there has been some yellowish drainage.   Home interventions: hot compress

## 2021-10-28 NOTE — Discharge Instructions (Addendum)
Requesting information about pilonidal cysts, help to drain them and how they are removed that had been unhelpful.  Please follow-up with your new primary care provider about your cyst and other management.  Due to the duration of this issue for you as well as the size of your lesion today, I strongly encourage you to consider surgical management of the cyst.  Recovery can be long and uncomfortable but it is well worth doing.  This cyst will begin to adversely affect your quality of life if it has not already.  It would be nice to get back to you with this anymore.  As we discussed, there is packing to prevent your wound to encourage the cyst to continue to completely drain.  Please change your dressing every day.  During dressing changes, pull out 1 inch of the packing out of the wound and trim off the excess apply bandaging.  I sent prescription for Clindamycin to your pharmacy that I would like you to take for the next 10 days.  Please take 1 capsule 4 times daily.  Please return in 7 days to have the site reevaluated and to remove the stitch I placed to keep the wound closed.    Please return sooner if you feel the wound is not improving or is becoming more painful.    Thank you for visiting [place] here at urgent care today.  I wish you all the best in your journey.

## 2021-11-07 ENCOUNTER — Encounter: Payer: Self-pay | Admitting: Family Medicine

## 2021-11-07 ENCOUNTER — Ambulatory Visit (INDEPENDENT_AMBULATORY_CARE_PROVIDER_SITE_OTHER): Payer: Managed Care, Other (non HMO) | Admitting: Family Medicine

## 2021-11-07 VITALS — BP 128/84 | HR 69 | Temp 97.6°F | Ht 68.5 in | Wt 261.0 lb

## 2021-11-07 DIAGNOSIS — L0501 Pilonidal cyst with abscess: Secondary | ICD-10-CM | POA: Diagnosis not present

## 2021-11-07 DIAGNOSIS — Z4802 Encounter for removal of sutures: Secondary | ICD-10-CM

## 2021-11-07 NOTE — Patient Instructions (Signed)
Please start clindamycin.  We are referring to surgery.  If sever pain, fevers, chills, go to Emergency Department

## 2021-11-07 NOTE — Assessment & Plan Note (Signed)
Improving after I&D  Referral to Gen Surgery due to recurrence Encouraged patient to start clindamycin Return precautions discussed

## 2021-11-07 NOTE — Progress Notes (Signed)
Assessment/Plan:   Problem List Items Addressed This Visit       Musculoskeletal and Integument   Pilonidal abscess - Primary    Improving after I&D  Referral to Gen Surgery due to recurrence Encouraged patient to start clindamycin Return precautions discussed      Relevant Orders   Ambulatory referral to General Surgery       Subjective:  HPI:  Seth Anderson is a 31 y.o. male who has Back pain, chronic and Pilonidal abscess on their problem list..   He  has a past medical history of Boil of buttock.Marland Kitchen   He presents with chief complaint of Establish Care (Patient had pilonidal cyst lanced on 10/7) .     Abscess: Patient presents for evaluation of a cutaneous abscess. Lesion is located in the left, superior  buttock. Onset was 4 weeks ago. Symptoms have improved, beginning 10 days ago.  Patient seen at ED on 10/28/2021, where pilonidal cyst was lanced and drained.  Reports improvement in symptoms since then.  Abscess has associated symptoms of none.  Patient endorses no more drainage, pain, fevers, chills.  Patient was prescribed clindamycin, however says that he did not have time to pick it up and has not started yet.  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  He reports having recurrence in the same location, once every 3 months, ongoing for "his whole life".  Patient does not have diabetes.  Suture removal and wound examination. Patient with 1 suture in place and packing.  Packing suture and packing material removed.  Scant drainage expressed.  Past Surgical History:  Procedure Laterality Date   WISDOM TOOTH EXTRACTION      Outpatient Medications Prior to Visit  Medication Sig Dispense Refill   clindamycin (CLEOCIN) 300 MG capsule Take 1 capsule (300 mg total) by mouth 4 (four) times daily for 10 days. (Patient not taking: Reported on 11/07/2021) 40 capsule 0   No facility-administered medications prior to visit.    Family History  Problem  Relation Age of Onset   Diabetes Mother    Hypertension Mother     Social History   Socioeconomic History   Marital status: Single    Spouse name: Not on file   Number of children: Not on file   Years of education: Not on file   Highest education level: Not on file  Occupational History   Not on file  Tobacco Use   Smoking status: Former    Packs/day: 0.50    Types: Cigarettes    Quit date: 2022    Years since quitting: 1.7    Passive exposure: Never   Smokeless tobacco: Never   Tobacco comments:    states he will cut back  Vaping Use   Vaping Use: Never used  Substance and Sexual Activity   Alcohol use: No   Drug use: No    Comment: 3x a month   Sexual activity: Not on file  Other Topics Concern   Not on file  Social History Narrative   Not on file   Social Determinants of Health   Financial Resource Strain: Not on file  Food Insecurity: Not on file  Transportation Needs: Not on file  Physical Activity: Not on file  Stress: Not on file  Social Connections: Not on file  Intimate Partner Violence: Not on file  Objective:  Physical Exam: BP 128/84 (BP Location: Left Arm, Patient Position: Sitting, Cuff Size: Large)   Pulse 69   Temp 97.6 F (36.4 C) (Temporal)   Ht 5' 8.5" (1.74 m)   Wt 261 lb (118.4 kg)   SpO2 98%   BMI 39.11 kg/m    General: No acute distress. Awake and conversant.  Eyes: Normal conjunctiva, anicteric. Round symmetric pupils.  ENT: Hearing grossly intact. No nasal discharge.  Neck: Neck is supple. No masses or thyromegaly.  Respiratory: Respirations are non-labored. No auditory wheezing.  Skin: Left superior gluteal cheek abscess, 1 stitch removed, packing removed, no drainage expressed, nontender Psych: Alert and oriented. Cooperative, Appropriate mood and affect, Normal judgment.  CV: No cyanosis or JVD MSK: Normal ambulation. No  clubbing  Neuro: Sensation and CN II-XII grossly normal.  Pilonidal cyst on left superior gluteal cheek       Garner Nash, MD, MS

## 2022-04-05 ENCOUNTER — Ambulatory Visit: Payer: Managed Care, Other (non HMO)

## 2022-04-05 ENCOUNTER — Ambulatory Visit (INDEPENDENT_AMBULATORY_CARE_PROVIDER_SITE_OTHER): Payer: Managed Care, Other (non HMO) | Admitting: Podiatry

## 2022-04-05 ENCOUNTER — Encounter: Payer: Self-pay | Admitting: Podiatry

## 2022-04-05 DIAGNOSIS — B353 Tinea pedis: Secondary | ICD-10-CM | POA: Diagnosis not present

## 2022-04-05 DIAGNOSIS — M778 Other enthesopathies, not elsewhere classified: Secondary | ICD-10-CM

## 2022-04-05 DIAGNOSIS — L603 Nail dystrophy: Secondary | ICD-10-CM | POA: Diagnosis not present

## 2022-04-05 DIAGNOSIS — M7662 Achilles tendinitis, left leg: Secondary | ICD-10-CM

## 2022-04-05 MED ORDER — TERBINAFINE HCL 250 MG PO TABS: 250.0000 mg | ORAL_TABLET | Freq: Every day | ORAL | 0 refills | Status: AC

## 2022-04-05 NOTE — Progress Notes (Signed)
  Subjective:  Patient ID: Seth Anderson, male    DOB: Mar 18, 1990,  MRN: [id number] HPI Chief Complaint  Patient presents with   Foot Pain    Toenails bilateral - very dark, thick, long nails, unable to cut well, thinks making toes sore on the right side  Posterior heel left - aching intermittent    32 y.o. male presents with the above complaint.   ROS: Denies fever chills nausea vomit muscle aches pains calf pain back pain chest pain shortness of breath.  Past Medical History:  Diagnosis Date   Boil of buttock    Past Surgical History:  Procedure Laterality Date   WISDOM TOOTH EXTRACTION      Current Outpatient Medications:    terbinafine (LAMISIL) 250 MG tablet, Take 1 tablet (250 mg total) by mouth daily., Disp: 30 tablet, Rfl: 0  No Known Allergies Review of Systems Objective:  There were no vitals filed for this visit.  General: Well developed, nourished, in no acute distress, alert and oriented x3   Dermatological: Skin is warm, dry and supple bilateral. Nails x 10 are thick dark and dystrophic painful on palpation as well as debridement remaining integument appears unremarkable at this time. There are no open sores, no preulcerative lesions, no rash or signs of infection present.  Interdigital tinea pedis is noted by bilateral  Vascular: Dorsalis Pedis artery and Posterior Tibial artery pedal pulses are 2/4 bilateral with immedate capillary fill time. Pedal hair growth present. No varicosities and no lower extremity edema present bilateral.   Neruologic: Grossly intact via light touch bilateral. Vibratory intact via tuning fork bilateral. Protective threshold with Semmes Wienstein monofilament intact to all pedal sites bilateral. Patellar and Achilles deep tendon reflexes 2+ bilateral. No Babinski or clonus noted bilateral.   Musculoskeletal: No gross boney pedal deformities bilateral. No pain, crepitus, or limitation noted with foot and ankle range of motion  bilateral. Muscular strength 5/5 in all groups tested bilateral.  Gait: Unassisted, Nonantalgic.    Radiographs:  None taken  Assessment & Plan:   Assessment: Interdigital tinea pedis and dystrophic nails bilateral.  Plan: Took samples of the skin and nail today for pathologic evaluation started on Lamisil 250 mg tablets.  He will take 1 tablet by mouth daily.  Follow-up with him in 30 days.     Mubashir Mallek T. [place], [place]

## 2022-04-18 ENCOUNTER — Telehealth: Payer: Self-pay | Admitting: *Deleted

## 2022-04-18 NOTE — Telephone Encounter (Signed)
Spoke with patient to update on results and to keep f/u appointment, verbalized understanding.

## 2022-04-18 NOTE — Telephone Encounter (Signed)
-----   Message from Garrel Ridgel, [place] sent at 04/16/2022  4:16 PM EDT ----- Positive for fungus - make sure he has a follow up

## 2022-05-03 ENCOUNTER — Ambulatory Visit: Payer: Managed Care, Other (non HMO) | Admitting: Podiatry

## 2022-05-03 ENCOUNTER — Ambulatory Visit
Admission: EM | Admit: 2022-05-03 | Discharge: 2022-05-03 | Disposition: A | Discharge status: Home / Self Care | Payer: Managed Care, Other (non HMO)

## 2022-05-03 DIAGNOSIS — R519 Headache, unspecified: Secondary | ICD-10-CM | POA: Diagnosis not present

## 2022-05-03 NOTE — ED Triage Notes (Signed)
Pt c/o R-sided throbbing HA with pressure to R eye that began today. Pt had same HA x2 weeks, which had subsided the last 3-4 days. Endorses nausea, blurred vision, and photophobia.

## 2022-05-03 NOTE — Discharge Instructions (Signed)
Please go to the emergency room for further evaluation of your headache 

## 2022-05-03 NOTE — ED Notes (Signed)
Patient is being discharged from the Urgent Care and sent to the Emergency Department via POV . Per Christoper Fabian, NP, patient is in need of higher level of care due to need for further evaluation. Patient is aware and verbalizes understanding of plan of care.  Vitals:   05/03/22 1904  BP: (!) 143/87  Pulse: (!) 59  Resp: 18  Temp: 99 F (37.2 C)  SpO2: 95%

## 2022-05-03 NOTE — ED Provider Notes (Signed)
[facility]    CSN: [id number] Arrival date & time: 05/03/22  1834      History   Chief Complaint Chief Complaint  Patient presents with   Headache    HPI Seth Anderson is a 32 y.o. male presents for evaluation of headache.  Patient reports today he developed a right-sided headache that he describes as a pressure that is behind his right eye and extends to the back of his head.  It is associated with nausea, photophobia, dizziness, and blurry vision.  Denies any vomiting, syncope.  States he had a similar headache that lasted 2 weeks and resolved on its own 3 to 4 days ago.  It is returned and he states it is worse than previously and he does rate this is a 12 out of 10 and the worst headache of his life.  He denies history of migraines or family history of migraines.  Denies family history of first-degree relative with SAH.  He does state he suffered a head injury at work a year ago resulting in a concussion.  He states since then he has had intermittent headaches but not as bad as currently.  States he had a negative CT of the head at the time of the injury.  Also reports he followed with neurology and was told there was nothing wrong.  He has not taken any medication for his headache.  No other concerns at this time.   Headache Associated symptoms: dizziness, nausea and photophobia     Past Medical History:  Diagnosis Date   Boil of buttock     Patient Active Problem List   Diagnosis Date Noted   Pilonidal abscess 11/07/2021   Back pain, chronic 11/06/2012    Past Surgical History:  Procedure Laterality Date   WISDOM TOOTH EXTRACTION         Home Medications    Prior to Admission medications   Medication Sig Start Date End Date Taking? Authorizing Provider  terbinafine (LAMISIL) 250 MG tablet Take 1 tablet (250 mg total) by mouth daily. 04/05/22   Hyatt, Max T, DPM    Family History Family History  Problem Relation Age of Onset   Diabetes Mother     Hypertension Mother     Social History Social History   Tobacco Use   Smoking status: Former    Packs/day: .5    Types: Cigarettes    Quit date: 2022    Years since quitting: 2.2    Passive exposure: Never   Smokeless tobacco: Never   Tobacco comments:    states he will cut back  Vaping Use   Vaping Use: Never used  Substance Use Topics   Alcohol use: No   Drug use: No    Comment: 3x a month     Allergies   Patient has no known allergies.   Review of Systems Review of Systems  Eyes:  Positive for photophobia and visual disturbance.  Gastrointestinal:  Positive for nausea.  Neurological:  Positive for dizziness and headaches.     Physical Exam Triage Vital Signs ED Triage Vitals  Enc Vitals Group     BP 05/03/22 1904 (!) 143/87     Pulse Rate 05/03/22 1904 (!) 59     Resp 05/03/22 1904 18     Temp 05/03/22 1904 99 F (37.2 C)     Temp Source 05/03/22 1904 Oral     SpO2 05/03/22 1904 95 %     Weight --  Height --      Head Circumference --      Peak Flow --      Pain Score 05/03/22 1901 10     Pain Loc --      Pain Edu? --      Excl. in GC? --    No data found.  Updated Vital Signs BP (!) 143/87 (BP Location: Right Arm)   Pulse (!) 59   Temp 99 F (37.2 C) (Oral)   Resp 18   SpO2 95%   Visual Acuity Right Eye Distance:   Left Eye Distance:   Bilateral Distance:    Right Eye Near:   Left Eye Near:    Bilateral Near:     Physical Exam Vitals and nursing note reviewed.  Constitutional:      Appearance: Normal appearance.     Comments: Patient appears uncomfortable but in no acute distress  HENT:     Head: Normocephalic and atraumatic.  Eyes:     Extraocular Movements: Extraocular movements intact.     Conjunctiva/sclera: Conjunctivae normal.     Pupils: Pupils are equal, round, and reactive to light.  Cardiovascular:     Rate and Rhythm: Normal rate.  Pulmonary:     Effort: Pulmonary effort is normal.  Skin:    General: Skin  is warm and dry.  Neurological:     General: No focal deficit present.     Mental Status: He is alert and oriented to person, place, and time.     GCS: GCS eye subscore is 4. GCS verbal subscore is 5. GCS motor subscore is 6.     Cranial Nerves: No facial asymmetry.     Motor: No weakness.     Coordination: Romberg sign positive.  Psychiatric:        Mood and Affect: Mood normal.        Behavior: Behavior normal.      UC Treatments / Results  Labs (all labs ordered are listed, but only abnormal results are displayed) Labs Reviewed - No data to display  EKG   Radiology No results found.  Procedures Procedures (including critical care time)  Medications Ordered in UC Medications - No data to display  Initial Impression / Assessment and Plan / UC Course  I have reviewed the triage vital signs and the nursing notes.  Pertinent labs & imaging results that were available during my care of the patient were reviewed by me and considered in my medical decision making (see chart for details).     Discussed with patient symptoms and exam.  Symptoms appear consistent with migraine but as he reports this is the worst headache of his life I advise he go to the emergency room for further evaluation.  Patient is in agreement with plan and will go POV to the ER with his girlfriend driving.  He was instructed to pull over and call 911 for any worsening symptoms that occur in transit and he verbalized understanding. Final Clinical Impressions(s) / UC Diagnoses   Final diagnoses:  Acute intractable headache, unspecified headache type     Discharge Instructions      Please go to the emergency room for further evaluation of your headache    ED Prescriptions   None    PDMP not reviewed this encounter.   Radford Pax, NP 05/03/22 1931

## 2022-05-29 ENCOUNTER — Ambulatory Visit: Payer: Managed Care, Other (non HMO) | Admitting: Podiatry

## 2023-01-24 ENCOUNTER — Emergency Department (HOSPITAL_BASED_OUTPATIENT_CLINIC_OR_DEPARTMENT_OTHER): Payer: No Typology Code available for payment source

## 2023-01-24 ENCOUNTER — Ambulatory Visit
Admission: EM | Admit: 2023-01-24 | Discharge: 2023-01-24 | Disposition: A | Discharge status: Home / Self Care | Payer: No Typology Code available for payment source | Attending: Family Medicine | Admitting: Family Medicine

## 2023-01-24 ENCOUNTER — Other Ambulatory Visit: Payer: Self-pay

## 2023-01-24 ENCOUNTER — Emergency Department (HOSPITAL_BASED_OUTPATIENT_CLINIC_OR_DEPARTMENT_OTHER)
Admission: EM | Admit: 2023-01-24 | Discharge: 2023-01-24 | Disposition: A | Discharge status: Home / Self Care | Payer: No Typology Code available for payment source | Attending: Emergency Medicine | Admitting: Emergency Medicine

## 2023-01-24 DIAGNOSIS — W2209XA Striking against other stationary object, initial encounter: Secondary | ICD-10-CM | POA: Diagnosis not present

## 2023-01-24 DIAGNOSIS — S6991XA Unspecified injury of right wrist, hand and finger(s), initial encounter: Secondary | ICD-10-CM

## 2023-01-24 DIAGNOSIS — S6291XA Unspecified fracture of right wrist and hand, initial encounter for closed fracture: Secondary | ICD-10-CM | POA: Diagnosis not present

## 2023-01-24 DIAGNOSIS — M79641 Pain in right hand: Secondary | ICD-10-CM | POA: Diagnosis present

## 2023-01-24 MED ORDER — IBUPROFEN 400 MG PO TABS
600.0000 mg | ORAL_TABLET | Freq: Once | ORAL | Status: AC
  Administered 2023-01-24: 600 mg via ORAL
  Filled 2023-01-24: qty 1

## 2023-01-24 MED ORDER — IBUPROFEN 600 MG PO TABS: 600.0000 mg | ORAL_TABLET | Freq: Four times a day (QID) | ORAL | 0 refills | Status: AC | PRN

## 2023-01-24 NOTE — ED Notes (Signed)
 ED Provider at bedside.

## 2023-01-24 NOTE — ED Triage Notes (Signed)
 Pt presents to UC for c/o right hand swelling and bruising. Pt states he punched a table earlier tonight.

## 2023-01-24 NOTE — ED Notes (Signed)
Reviewed discharge instructions, medications, and home care with pt. Pt verbalized understanding and had no further questions. Pt exited ED without complications.

## 2023-01-24 NOTE — ED Provider Notes (Signed)
 [facility]    CSN: [id number] Arrival date & time: 01/24/23  1948      History   Chief Complaint No chief complaint on file.   HPI Seth Anderson is a 33 y.o. male presents for hand pain.  Patient states 1 hour prior to arrival he punched a table with his right hand.  States he has had bruising and swelling to the lateral aspect of his hand since that time.  No numbness or tingling.  No history of injuries or surgeries to the hand in the past.  He has not taken any OTC medications.  No other concerns at this time.  HPI  Past Medical History:  Diagnosis Date   Boil of buttock     Patient Active Problem List   Diagnosis Date Noted   Pilonidal abscess 11/07/2021   Back pain, chronic 11/06/2012    Past Surgical History:  Procedure Laterality Date   WISDOM TOOTH EXTRACTION         Home Medications    Prior to Admission medications   Medication Sig Start Date End Date Taking? Authorizing Provider  terbinafine  (LAMISIL ) 250 MG tablet Take 1 tablet (250 mg total) by mouth daily. 04/05/22   Hyatt, Max T, DPM    Family History Family History  Problem Relation Age of Onset   Diabetes Mother    Hypertension Mother     Social History Social History   Tobacco Use   Smoking status: Former    Current packs/day: 0.00    Types: Cigarettes    Quit date: 2022    Years since quitting: 3.0    Passive exposure: Never   Smokeless tobacco: Never   Tobacco comments:    states he will cut back  Vaping Use   Vaping status: Never Used  Substance Use Topics   Alcohol use: No   Drug use: No    Comment: 3x a month     Allergies   Patient has no known allergies.   Review of Systems Review of Systems  Musculoskeletal:        Right hand injury     Physical Exam Triage Vital Signs ED Triage Vitals  Encounter Vitals Group     BP 01/24/23 1953 (!) 152/95     Systolic BP Percentile --      Diastolic BP Percentile --      Pulse Rate 01/24/23 1953 94      Resp 01/24/23 1953 16     Temp 01/24/23 1953 99.3 F (37.4 C)     Temp src --      SpO2 01/24/23 1953 95 %     Weight --      Height --      Head Circumference --      Peak Flow --      Pain Score 01/24/23 1952 8     Pain Loc --      Pain Education --      Exclude from Growth Chart --    No data found.  Updated Vital Signs BP (!) 152/95 (BP Location: Right Arm)   Pulse 94   Temp 99.3 F (37.4 C)   Resp 16   SpO2 95%   Visual Acuity Right Eye Distance:   Left Eye Distance:   Bilateral Distance:    Right Eye Near:   Left Eye Near:    Bilateral Near:     Physical Exam Vitals and nursing note reviewed.  Constitutional:  General: He is not in acute distress.    Appearance: Normal appearance. He is not ill-appearing.  HENT:     Head: Normocephalic and atraumatic.  Eyes:     Pupils: Pupils are equal, round, and reactive to light.  Cardiovascular:     Rate and Rhythm: Normal rate.  Pulmonary:     Effort: Pulmonary effort is normal.  Musculoskeletal:       Hands:     Comments: Moderate swelling with tenderness overlying the right fifth metacarpal.  No obvious deformity.  Skin:    General: Skin is warm.  Neurological:     General: No focal deficit present.     Mental Status: He is alert and oriented to person, place, and time.  Psychiatric:        Mood and Affect: Mood normal.        Behavior: Behavior normal.      UC Treatments / Results  Labs (all labs ordered are listed, but only abnormal results are displayed) Labs Reviewed - No data to display  EKG   Radiology No results found.  Procedures Procedures (including critical care time)  Medications Ordered in UC Medications - No data to display  Initial Impression / Assessment and Plan / UC Course  I have reviewed the triage vital signs and the nursing notes.  Pertinent labs & imaging results that were available during my care of the patient were reviewed by me and considered in my medical  decision making (see chart for details).     Reviewed exam and symptoms with patient.  No x-ray available onsite at time of evaluation and concern if sent to have x-ray done elsewhere would not get the results until tomorrow.  Concern for boxer fracture given presentation and nature of injury.  Given this I did not want to delay treatment so advised patient go to the med center so he can have x-ray and treatment done tonight.  He is in agreement with plan will go POV to the med center. Final Clinical Impressions(s) / UC Diagnoses   Final diagnoses:  Injury of right hand, initial encounter     Discharge Instructions      Patient to ER for further workup and treatment    ED Prescriptions   None    PDMP not reviewed this encounter.   Loreda Myla SAUNDERS, NP 01/24/23 2005

## 2023-01-24 NOTE — Discharge Instructions (Addendum)
 As discussed, you do have evidence of boxer's fracture.  We have treated this with splint immobilization while in the emergency department.  Recommend taking anti-inflammatories for pain as well as icing area.  Attached is number for hand specialist to follow-up with regarding this fracture for reevaluation.  Please do not hesitate to return if the worrisome signs and symptoms we discussed become apparent.

## 2023-01-24 NOTE — ED Provider Notes (Signed)
 [place] [facility] Provider Note   CSN: [id number] Arrival date & time: 01/24/23  2020     History  Chief Complaint  Patient presents with   Hand Pain    Right    Seth Anderson is a 33 y.o. male.   Hand Pain   33 year old male presents emergency department with complaints of right-sided hand pain.  Patient states that he punched a table around 5 to 6 PM this evening out of frustration.  Reports pain to the right part of his right hand.  Patient is right-handed and currently unemployed.  Denies any weakness or sensory deficits in affected digits/hand.  Denies trauma elsewhere.  Past medical  Home Medications Prior to Admission medications   Medication Sig Start Date End Date Taking? Authorizing Provider  ibuprofen  (ADVIL ) 600 MG tablet Take 1 tablet (600 mg total) by mouth every 6 (six) hours as needed. 01/24/23  Yes Silver Fell A, PA  terbinafine  (LAMISIL ) 250 MG tablet Take 1 tablet (250 mg total) by mouth daily. 04/05/22   Hyatt, Max T, DPM      Allergies    Patient has no known allergies.    Review of Systems   Review of Systems  Physical Exam Updated Vital Signs BP (!) 158/94 (BP Location: Right Arm)   Pulse 86   Temp 98.5 F (36.9 C) (Oral)   SpO2 100%  Physical Exam Vitals and nursing note reviewed.  Constitutional:      General: He is not in acute distress.    Appearance: He is well-developed.  HENT:     Head: Normocephalic and atraumatic.  Eyes:     Conjunctiva/sclera: Conjunctivae normal.  Cardiovascular:     Rate and Rhythm: Normal rate and regular rhythm.     Heart sounds: No murmur heard. Pulmonary:     Effort: Pulmonary effort is normal. No respiratory distress.     Breath sounds: Normal breath sounds.  Abdominal:     Palpations: Abdomen is soft.     Tenderness: There is no abdominal tenderness.  Musculoskeletal:        General: Swelling present.     Cervical back: Neck supple.     Comments:  Swelling noted on the dorsal aspect of right hand along fifth and fourth metacarpals.  Tenderness fifth and fourth metacarpals proximally as well as extension along distal aspects.  Tender to palpation of proximal phalanx without tenderness of middle or distal phalanx.  Able to range digit fully in flexion and extension.  Intact sensation distally.  Capillary fill less than 2 seconds.  No other tenderness of upper extremity.  Skin:    General: Skin is warm and dry.     Capillary Refill: Capillary refill takes less than 2 seconds.  Neurological:     Mental Status: He is alert.  Psychiatric:        Mood and Affect: Mood normal.     ED Results / Procedures / Treatments   Labs (all labs ordered are listed, but only abnormal results are displayed) Labs Reviewed - No data to display  EKG None  Radiology DG Hand Complete Right Result Date: 01/24/2023 CLINICAL DATA:  Punched a table EXAM: RIGHT HAND - COMPLETE 3+ VIEW COMPARISON:  None Available. FINDINGS: Acute mildly comminuted fracture between the bases of the fourth and fifth metacarpals, suspect displaced intra-articular fragment from the base of the fifth metacarpal. Soft tissue swelling is present. IMPRESSION: Acute mildly comminuted fracture between the bases of the  fourth and fifth metacarpals, suspect displaced intra-articular fragment from the base of the fifth metacarpal. Electronically Signed   By: Luke Bun M.D.   On: 01/24/2023 22:23    Procedures Procedures    Medications Ordered in ED Medications  ibuprofen  (ADVIL ) tablet 600 mg (600 mg Oral Given 01/24/23 2239)    ED Course/ Medical Decision Making/ A&P Clinical Course as of 01/24/23 2310  Thu Jan 24, 2023  2244 Hand fracture, punched a table, no angulation on my measurement, mildly displaced.  Ulnar gutter follow-up with hand surgery within a week. [CC]    Clinical Course User Index [CC] Jerral Meth, MD                                 Medical Decision  Making Amount and/or Complexity of Data Reviewed Radiology: ordered.   This patient presents to the ED for concern of hand pain, this involves an extensive number of treatment options, and is a complaint that carries with it a high risk of complications and morbidity.  The differential diagnosis includes fracture, strain/pain, dislocation, ligamentous/tendinous injury, neurovascular compromise, other   Co morbidities that complicate the patient evaluation  See HPI   Additional history obtained:  Additional history obtained from EMR External records from outside source obtained and reviewed including hospital records   Lab Tests:  N/a   Imaging Studies ordered:  I ordered imaging studies including left hand x-ray  I independently visualized and interpreted imaging which showed acute mildly comminuted fracture between bases of fourth and fifth metacarpals.  Suspect displaced intra-articular fragment from base of fifth metacarpal. I agree with the radiologist interpretation  Cardiac Monitoring: / EKG:  The patient was maintained on a cardiac monitor.  I personally viewed and interpreted the cardiac monitored which showed an underlying rhythm of: Sinus rhythm   Consultations Obtained:  I requested consultation with attending physician Dr. Jerral who is in agreement treatment plan going forward.   Problem List / ED Course / Critical interventions / Medication management  Right hand fracture I ordered medication including Motrin    Reevaluation of the patient after these medicines showed that the patient improved I have reviewed the patients home medicines and have made adjustments as needed   Social Determinants of Health:  Former cigarette use.  Denies illicit drug use.  Test / Admission - Considered:  Right hand fracture Vitals signs significant for hypertension blood pressure 150/94. Otherwise within normal range and stable throughout visit. Imaging studies  significant for: see above 33 year old male presents emergency department with complaints of right-sided hand pain.  Hand pain occurred after he punched a table.  Exam, tender to palpation base of fifth and fourth metacarpals as well as proximal aspect of proximal phalanx of fifth digit.  X-ray concerning for fractures as above of fifth and fourth metacarpals.  Placed in ulnar gutter splint by nursing staff as well as given a sling.  Will recommend rest, ice, elevation, NSAIDs and follow-up with hand specialist in the outpatient setting.  Treatment plan discussed at length with patient and he acknowledged understanding was agreeable to said plan.  Patient overall well-appearing, afebrile in no acute distress. Worrisome signs and symptoms were discussed with the patient, and the patient acknowledged understanding to return to the ED if noticed. Patient was stable upon discharge.          Final Clinical Impression(s) / ED Diagnoses Final diagnoses:  Closed fracture of  right hand, initial encounter    Rx / DC Orders ED Discharge Orders          Ordered    ibuprofen  (ADVIL ) 600 MG tablet  Every 6 hours PRN        01/24/23 2248              Silver Wonda LABOR, [place] 01/24/23 2310    Jerral Meth, MD 01/25/23 [phone]

## 2023-01-24 NOTE — ED Notes (Signed)
 Ulnar splint and shoulder sling applied to right arm. Pt tolerated well.

## 2023-01-24 NOTE — ED Triage Notes (Signed)
 Punched table.

## 2023-01-24 NOTE — Discharge Instructions (Addendum)
 Patient to ER for further workup and treatment

## 2023-03-14 ENCOUNTER — Ambulatory Visit: Payer: No Typology Code available for payment source | Admitting: Podiatry

## 2023-06-07 ENCOUNTER — Ambulatory Visit (HOSPITAL_COMMUNITY)
Admission: EM | Admit: 2023-06-07 | Discharge: 2023-06-07 | Disposition: A | Discharge status: Home / Self Care | Attending: Family Medicine | Admitting: Family Medicine

## 2023-06-07 ENCOUNTER — Encounter (HOSPITAL_COMMUNITY): Payer: Self-pay

## 2023-06-07 DIAGNOSIS — L03317 Cellulitis of buttock: Secondary | ICD-10-CM | POA: Diagnosis not present

## 2023-06-07 DIAGNOSIS — L0231 Cutaneous abscess of buttock: Secondary | ICD-10-CM

## 2023-06-07 MED ORDER — LIDOCAINE-EPINEPHRINE 1 %-1:100000 IJ SOLN
INTRAMUSCULAR | Status: AC
  Filled 2023-06-07: qty 1

## 2023-06-07 MED ORDER — DOXYCYCLINE HYCLATE 100 MG PO CAPS: 100.0000 mg | ORAL_CAPSULE | Freq: Two times a day (BID) | ORAL | 0 refills | Status: DC

## 2023-06-07 NOTE — Discharge Instructions (Signed)
 You were seen today for abscess of your buttocks.  This has been opened and drained today.  Please keep the area covered while it drains with gauze and tape.   I have sent out an oral antibiotic as well.  As discussed, this will be a recurrent issue for you, unless you have this surgically removed.  You may wish to see the general surgeon.  You may call [facility] at [phone] for an appointment.

## 2023-06-07 NOTE — ED Provider Notes (Signed)
 [facility]    CSN: [id number] Arrival date & time: 06/07/23  1417      History   Chief Complaint Chief Complaint  Patient presents with   Abscess    HPI Seth Anderson is a 33 y.o. male.    Abscess  He is here today for an abscess to the buttock x 1 week.  He has had this issue before.  The area is painful and swollen.  No recent drainage.  No fevers/chills. No n/v.        Past Medical History:  Diagnosis Date   Boil of buttock     Patient Active Problem List   Diagnosis Date Noted   Pilonidal abscess 11/07/2021   Back pain, chronic 11/06/2012    Past Surgical History:  Procedure Laterality Date   WISDOM TOOTH EXTRACTION         Home Medications    Prior to Admission medications   Medication Sig Start Date End Date Taking? Authorizing Provider  doxycycline  (VIBRAMYCIN ) 100 MG capsule Take 1 capsule (100 mg total) by mouth 2 (two) times daily. 06/07/23  Yes Carianna Lague, [place] Dales, MD  ibuprofen  (ADVIL ) 600 MG tablet Take 1 tablet (600 mg total) by mouth every 6 (six) hours as needed. 01/24/23   [place] Butter, PA  terbinafine  (LAMISIL ) 250 MG tablet Take 1 tablet (250 mg total) by mouth daily. 04/05/22   Hyatt, Max T, DPM    Family History Family History  Problem Relation Age of Onset   Diabetes Mother    Hypertension Mother     Social History Social History   Tobacco Use   Smoking status: Former    Current packs/day: 0.00    Types: Cigarettes    Quit date: 2022    Years since quitting: 3.3    Passive exposure: Never   Smokeless tobacco: Never   Tobacco comments:    states he will cut back  Vaping Use   Vaping status: Never Used  Substance Use Topics   Alcohol use: No   Drug use: No    Comment: 3x a month     Allergies   Patient has no known allergies.   Review of Systems Review of Systems  Constitutional: Negative.   HENT: Negative.    Gastrointestinal: Negative.      Physical Exam Triage Vital Signs ED Triage  Vitals  Encounter Vitals Group     BP 06/07/23 1427 128/77     Systolic BP Percentile --      Diastolic BP Percentile --      Pulse Rate 06/07/23 1427 100     Resp 06/07/23 1427 16     Temp 06/07/23 1427 98.5 F (36.9 C)     Temp Source 06/07/23 1427 Oral     SpO2 06/07/23 1427 96 %     Weight --      Height --      Head Circumference --      Peak Flow --      Pain Score 06/07/23 1429 0     Pain Loc --      Pain Education --      Exclude from Growth Chart --    No data found.  Updated Vital Signs BP 128/77 (BP Location: Left Arm)   Pulse 100   Temp 98.5 F (36.9 C) (Oral)   Resp 16   SpO2 96%   Visual Acuity Right Eye Distance:   Left Eye Distance:   Bilateral Distance:  Right Eye Near:   Left Eye Near:    Bilateral Near:     Physical Exam Constitutional:      Appearance: Normal appearance. He is normal weight.  Skin:    Comments: At the left upper buttocks near the gluteal cleft is a raised, tender abscess;  fluctuance is noted;    Neurological:     Mental Status: He is alert.      UC Treatments / Results  Labs (all labs ordered are listed, but only abnormal results are displayed) Labs Reviewed - No data to display  EKG   Radiology No results found.  Procedures Incision and Drainage  Date/Time: 06/07/2023 3:01 PM  Performed by: Lesle Ras, MD Authorized by: Lesle Ras, MD   Consent:    Consent obtained:  Verbal   Consent given by:  Patient   Risks discussed:  Bleeding Location:    Type:  Abscess   Size:  3   Location:  Anogenital   Anogenital location:  Gluteal cleft Pre-procedure details:    Skin preparation:  Chlorhexidine with alcohol Sedation:    Sedation type:  None Anesthesia:    Anesthesia method:  Local infiltration   Local anesthetic:  Lidocaine  1% WITH epi Procedure type:    Complexity:  Simple Procedure details:    Incision types:  Stab incision   Incision depth:  Dermal   Drainage:  Serosanguinous   Drainage  amount:  Copious   Wound treatment:  Wound left open   Packing materials:  None Post-procedure details:    Procedure completion:  Tolerated  (including critical care time)  Medications Ordered in UC Medications - No data to display  Initial Impression / Assessment and Plan / UC Course  I have reviewed the triage vital signs and the nursing notes.  Pertinent labs & imaging results that were available during my care of the patient were reviewed by me and considered in my medical decision making (see chart for details).   Final Clinical Impressions(s) / UC Diagnoses   Final diagnoses:  Cellulitis and abscess of buttock   Discharge Instructions      You were seen today for abscess of your buttocks.  This has been opened and drained today.  Please keep the area covered while it drains with gauze and tape.   I have sent out an oral antibiotic as well.  As discussed, this will be a recurrent issue for you, unless you have this surgically removed.  You may wish to see the general surgeon.  You may call [facility] at [phone] for an appointment.   ED Prescriptions     Medication Sig Dispense Auth. Provider   doxycycline  (VIBRAMYCIN ) 100 MG capsule Take 1 capsule (100 mg total) by mouth 2 (two) times daily. 20 capsule Lesle Ras, MD      PDMP not reviewed this encounter.   Lesle Ras, MD 06/07/23 [phone]

## 2023-06-07 NOTE — ED Triage Notes (Signed)
 Pt states abscess to his buttocks for one week.

## 2023-09-05 ENCOUNTER — Ambulatory Visit
Admission: EM | Admit: 2023-09-05 | Discharge: 2023-09-05 | Disposition: A | Discharge status: Home / Self Care | Attending: Family Medicine | Admitting: Family Medicine

## 2023-09-05 DIAGNOSIS — L03317 Cellulitis of buttock: Secondary | ICD-10-CM | POA: Diagnosis not present

## 2023-09-05 DIAGNOSIS — L0231 Cutaneous abscess of buttock: Secondary | ICD-10-CM | POA: Diagnosis not present

## 2023-09-05 MED ORDER — CEFTRIAXONE SODIUM 1 G IJ SOLR
1.0000 g | Freq: Once | INTRAMUSCULAR | Status: AC
  Administered 2023-09-05: 1 g via INTRAMUSCULAR

## 2023-09-05 MED ORDER — DOXYCYCLINE HYCLATE 100 MG PO CAPS: 100.0000 mg | ORAL_CAPSULE | Freq: Two times a day (BID) | ORAL | 0 refills | Status: AC

## 2023-09-05 NOTE — ED Provider Notes (Signed)
 [facility]    CSN: [id number] Arrival date & time: 09/05/23  1745      History   Chief Complaint Chief Complaint  Patient presents with   Abscess    HPI Seth Anderson is a 33 y.o. male presents for abscess.  Patient reports 3 days of a worsening painful abscess to his left upper buttock.  Denies any drainage, fevers or chills.  No history of MRSA.  Has had abscesses in this area in the past.  Was last seen in urgent care on May 16 for abscess that was drained in clinic and he was started on doxycycline with resolution of symptoms.  He has not used any OTC treatments for symptoms.  No other concerns at this time.   Abscess   Past Medical History:  Diagnosis Date   Boil of buttock     Patient Active Problem List   Diagnosis Date Noted   Pilonidal abscess 11/07/2021   Back pain, chronic 11/06/2012    Past Surgical History:  Procedure Laterality Date   WISDOM TOOTH EXTRACTION         Home Medications    Prior to Admission medications   Medication Sig Start Date End Date Taking? Authorizing Provider  doxycycline (VIBRAMYCIN) 100 MG capsule Take 1 capsule (100 mg total) by mouth 2 (two) times daily. 09/05/23  Yes Nejla Reasor, Jodi R, NP  ibuprofen (ADVIL) 600 MG tablet Take 1 tablet (600 mg total) by mouth every 6 (six) hours as needed. 01/24/23   Silver Wonda LABOR, PA  terbinafine (LAMISIL) 250 MG tablet Take 1 tablet (250 mg total) by mouth daily. 04/05/22   Hyatt, Max T, DPM    Family History Family History  Problem Relation Age of Onset   Diabetes Mother    Hypertension Mother     Social History Social History   Tobacco Use   Smoking status: Former    Current packs/day: 0.00    Types: Cigarettes    Quit date: 2022    Years since quitting: 3.6    Passive exposure: Never   Smokeless tobacco: Never   Tobacco comments:    states he will cut back  Vaping Use   Vaping status: Never Used  Substance Use Topics   Alcohol use: No   Drug use: No     Comment: 3x a month     Allergies   Patient has no known allergies.   Review of Systems Review of Systems  Skin:  Positive for wound.     Physical Exam Triage Vital Signs ED Triage Vitals [09/05/23 1802]  Encounter Vitals Group     BP (!) 145/90     Girls Systolic BP Percentile      Girls Diastolic BP Percentile      Boys Systolic BP Percentile      Boys Diastolic BP Percentile      Pulse Rate 87     Resp 16     Temp 99.4 F (37.4 C)     Temp Source Oral     SpO2 96 %     Weight      Height      Head Circumference      Peak Flow      Pain Score 8     Pain Loc      Pain Education      Exclude from Growth Chart    No data found.  Updated Vital Signs BP (!) 145/90 (BP Location: Right Arm)  Pulse 87   Temp 99.4 F (37.4 C) (Oral)   Resp 16   SpO2 96%   Visual Acuity Right Eye Distance:   Left Eye Distance:   Bilateral Distance:    Right Eye Near:   Left Eye Near:    Bilateral Near:     Physical Exam Vitals and nursing note reviewed.  Constitutional:      General: He is not in acute distress.    Appearance: Normal appearance. He is not ill-appearing.  HENT:     Head: Normocephalic and atraumatic.  Eyes:     Pupils: Pupils are equal, round, and reactive to light.  Cardiovascular:     Rate and Rhythm: Normal rate.  Pulmonary:     Effort: Pulmonary effort is normal.  Skin:    General: Skin is warm and dry.         Comments: 3 x 4 cm indurated nonfluctuant abscess to the left upper buttock adjacent to gluteal cleft.  Erythema and warmth surrounding.  No active drainage.  Tender to palpation.  Neurological:     General: No focal deficit present.     Mental Status: He is alert and oriented to person, place, and time.  Psychiatric:        Mood and Affect: Mood normal.        Behavior: Behavior normal.      UC Treatments / Results  Labs (all labs ordered are listed, but only abnormal results are displayed) Labs Reviewed - No data to  display  EKG   Radiology No results found.  Procedures Procedures (including critical care time)  Medications Ordered in UC Medications  cefTRIAXone (ROCEPHIN) injection 1 g (has no administration in time range)    Initial Impression / Assessment and Plan / UC Course  I have reviewed the triage vital signs and the nursing notes.  Pertinent labs & imaging results that were available during my care of the patient were reviewed by me and considered in my medical decision making (see chart for details).     Reviewed exam and symptoms with patient.  No fluctuance on abscess. given low-grade fever in clinic patient was given IM ceftriaxone in clinic.  He was monitored for 10 minutes after injection with no reaction noted and tolerated well.  Will start doxycycline twice daily for 10 days and encouraged warm compresses.  He was instructed to go to the ER for any worsening symptoms, red flags reviewed and he verbalized understanding.  He was advised to follow-up with his PCP in 2 to 3 days for recheck. Final Clinical Impressions(s) / UC Diagnoses   Final diagnoses:  Cellulitis and abscess of buttock     Discharge Instructions      Start doxycycline twice daily for 10 days.  Continue warm compresses to the area frequently.  Please follow-up with your PCP in 2 to 3 days for recheck.  Please go to the emergency room if you develop any worsening symptoms.  Hope you feel better soon!     ED Prescriptions     Medication Sig Dispense Auth. Provider   doxycycline (VIBRAMYCIN) 100 MG capsule Take 1 capsule (100 mg total) by mouth 2 (two) times daily. 20 capsule Seth Anderson, Jodi R, NP      PDMP not reviewed this encounter.   Loreda Myla SAUNDERS, NP 09/05/23 [phone]

## 2023-09-05 NOTE — ED Triage Notes (Signed)
 Pt present with an abscess to the lower back. Pt states he has had the abscess for three days. Reports pain and discomfort. Pt states it has not been draining but feels soft.

## 2023-09-05 NOTE — Discharge Instructions (Addendum)
 Start doxycycline twice daily for 10 days.  Continue warm compresses to the area frequently.  Please follow-up with your PCP in 2 to 3 days for recheck.  Please go to the emergency room if you develop any worsening symptoms.  Hope you feel better soon!

## 2023-10-14 IMAGING — CT CT HEAD W/O CM
3 series · 14 of 47 positions shown, 16 images · non-contrast
Comparison: None.

CLINICAL DATA: Trauma



[Series 2: head wo · axial · 0.47mm/px · z∈[-127,-2]mm · 8 of 31 slices shown, 10 images]
[im 3/31  brain]
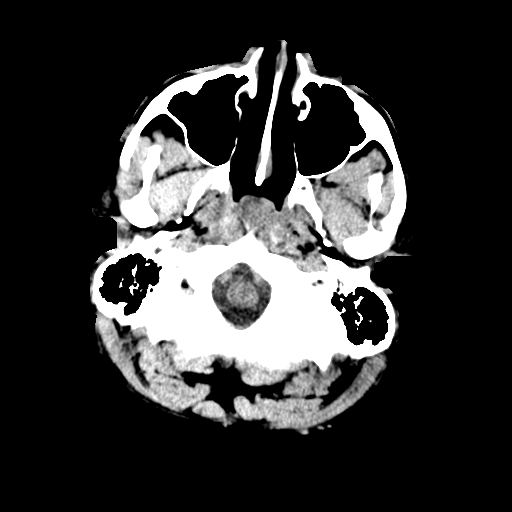
[im 3/31  bone]
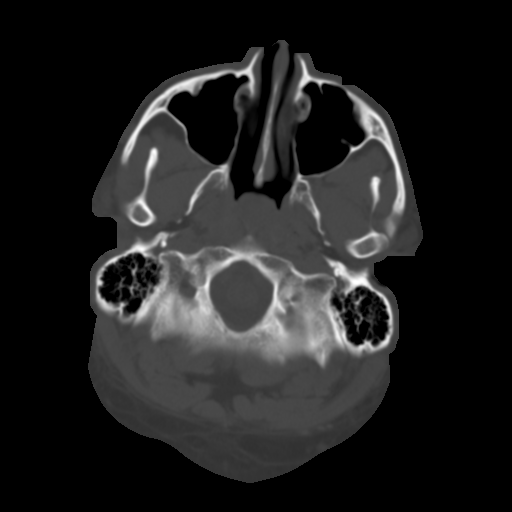
[im 7/31  brain]
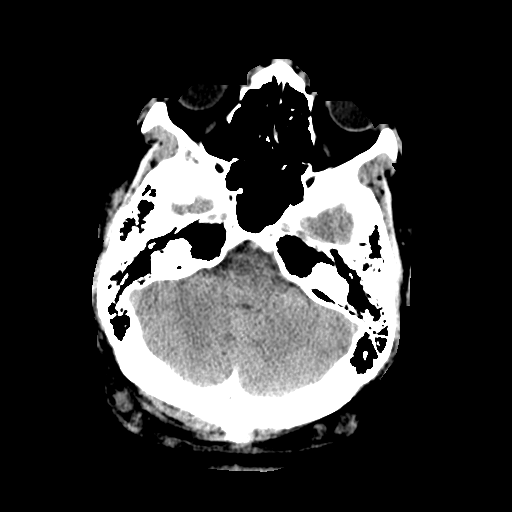
[im 10/31  brain]
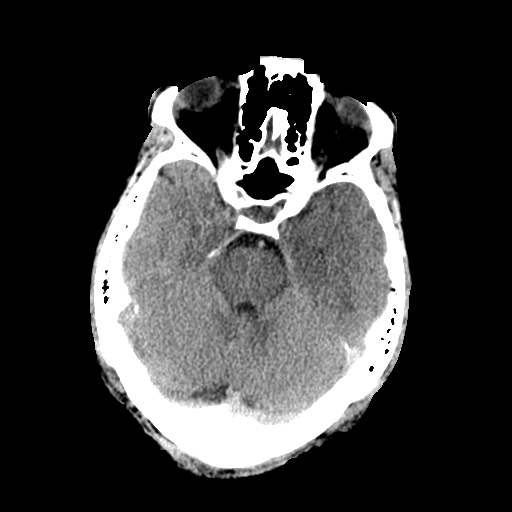
[im 14/31  brain]
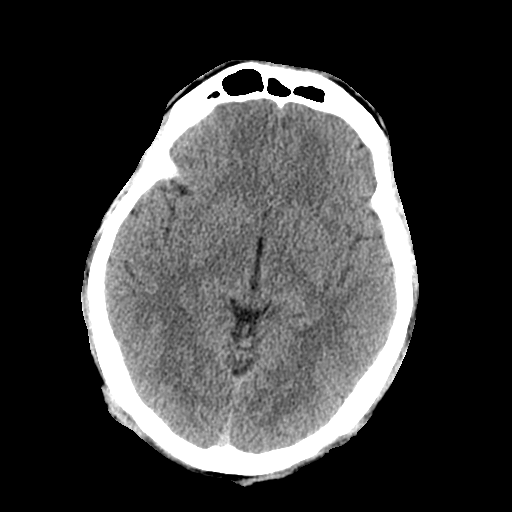
[im 17/31  brain]
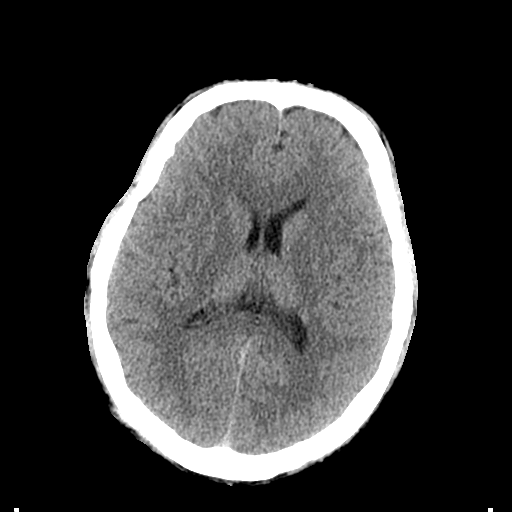
[im 17/31  bone]
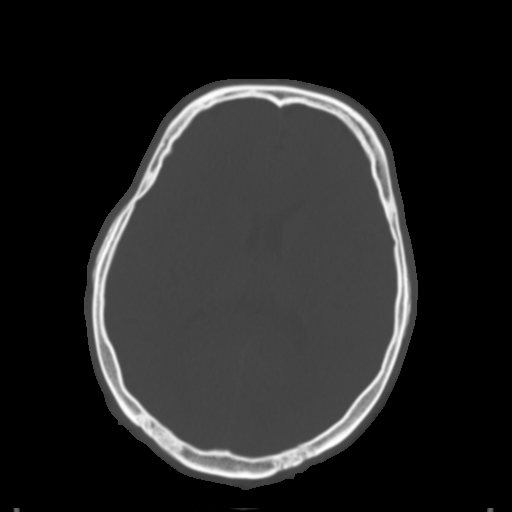
[im 21/31  brain]
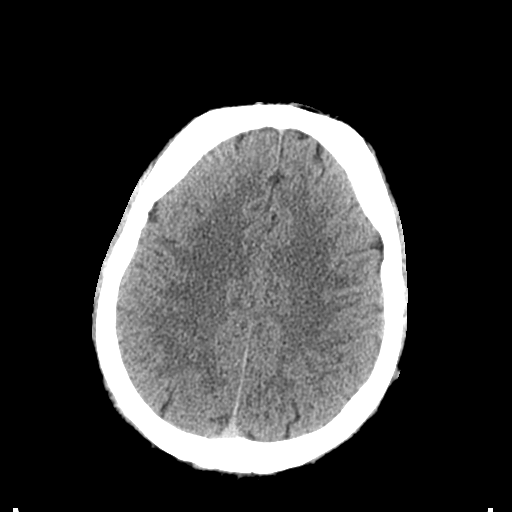
[im 24/31  brain]
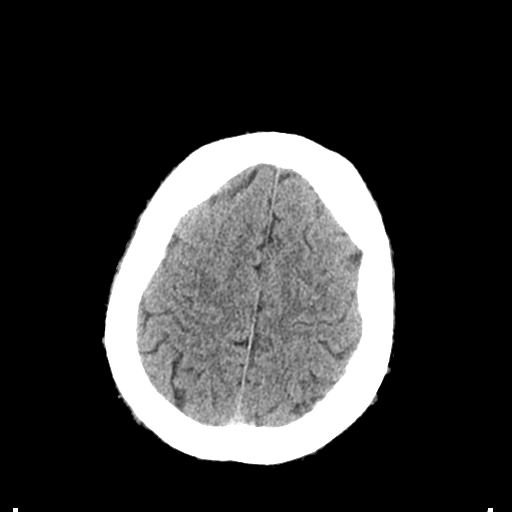
[im 28/31  brain]
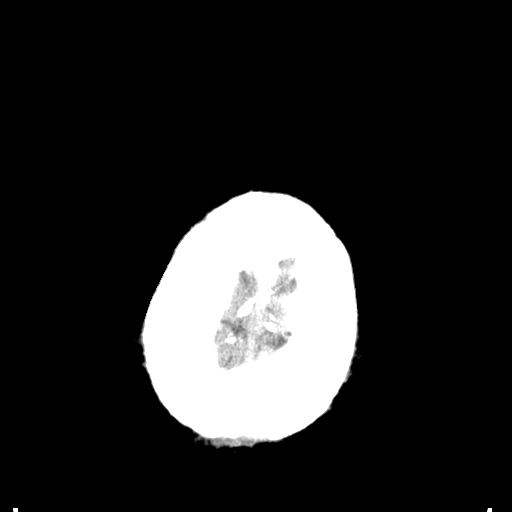

[Series 5: coronal soft tissue · coronal · 0.32mm/px · 3 of 81 slices shown]
[im 27/81  brain]
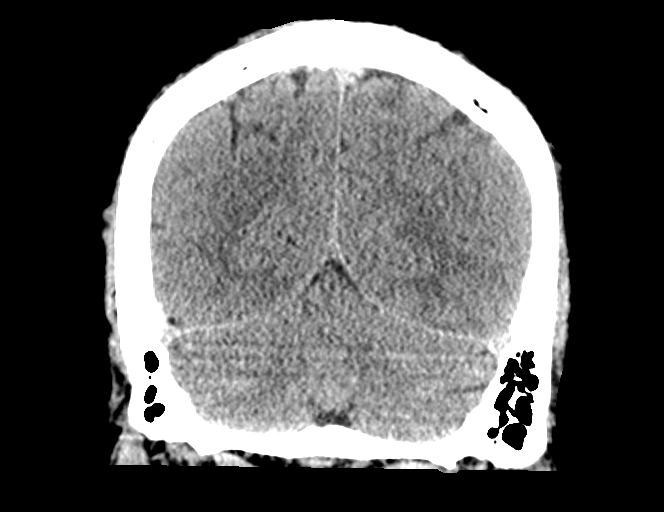
[im 36/81  brain]
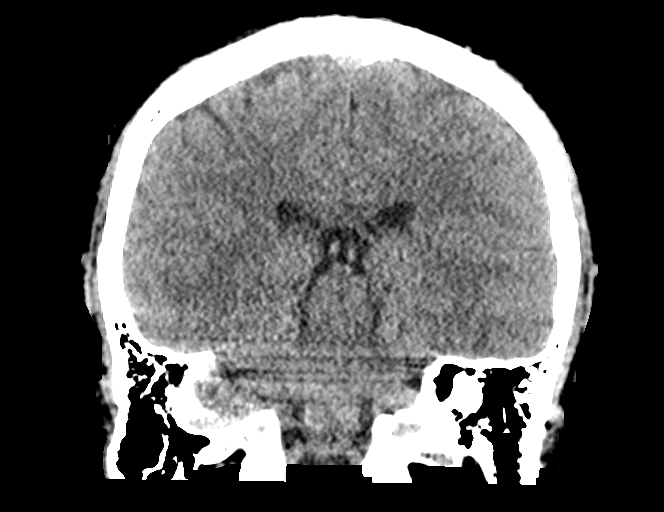
[im 45/81  brain]
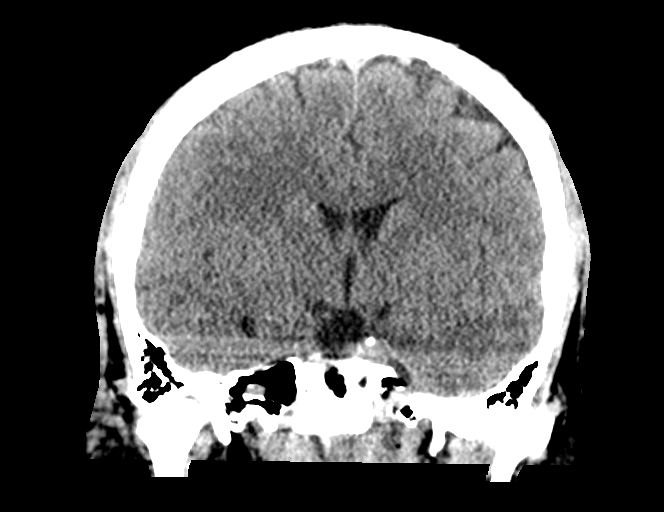

[Series 6: sagittal soft tissue · sagittal · 0.31mm/px · 3 of 60 slices shown]
[im 20/60  brain]
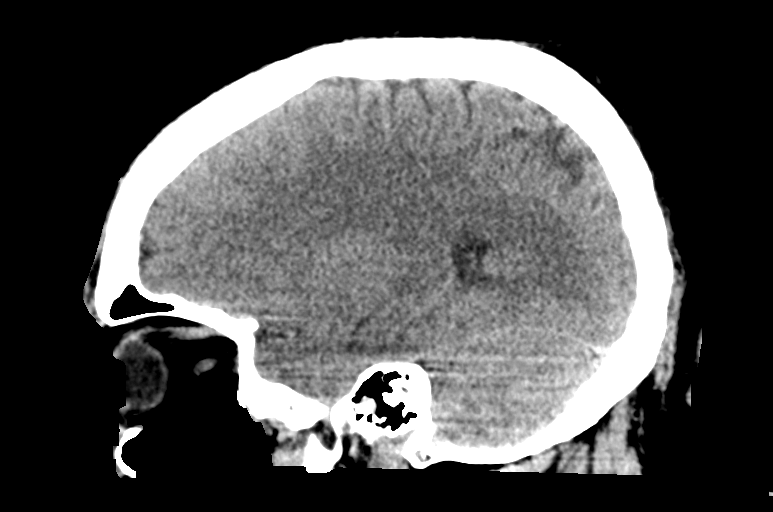
[im 30/60  brain]
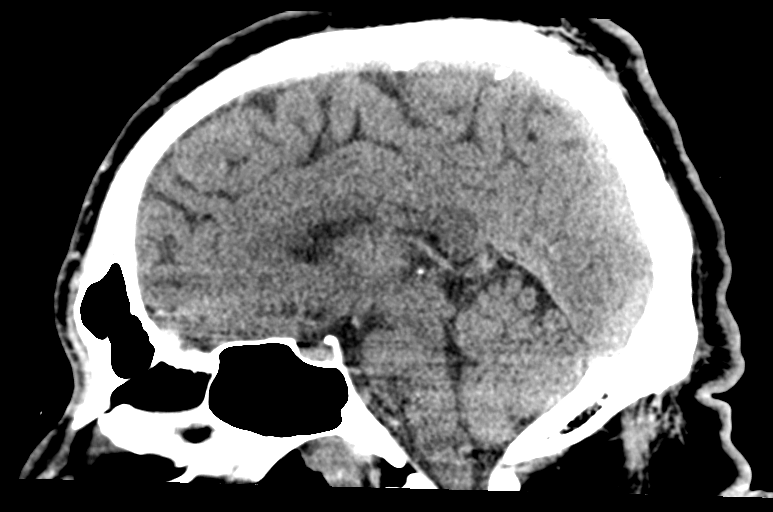
[im 40/60  brain]
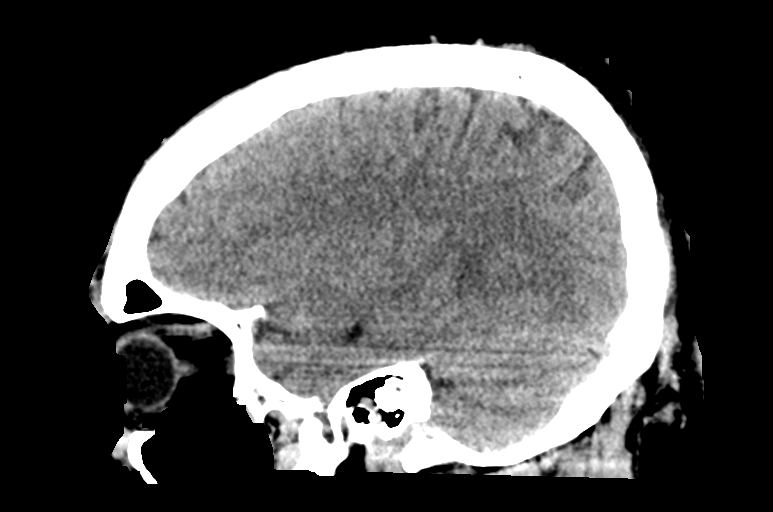

[14 of 47 positions shown; findings below may reference images not displayed]

FINDINGS: Brain: No acute intracranial findings are seen in noncontrast CT
brain. There are no signs of bleeding. Ventricles are not dilated.
There is no shift of midline structures.

Vascular: Unremarkable.

Skull: No fracture is seen. There is subcutaneous contusion in the
parietal scalp.

Sinuses/Orbits: There is mild mucosal thickening in the ethmoid and
maxillary sinuses.

Other: None
IMPRESSION: No acute intracranial findings are seen in noncontrast CT brain.
